# Patient Record
Sex: Male | Born: 1960 | Race: White | Hispanic: No | Smoking: Current some day smoker
Health system: Southern US, Community
[De-identification: ages and names within clinical notes are randomized; demographics above are authoritative.]

## PROBLEM LIST (undated history)

## (undated) DIAGNOSIS — IMO0002 Reserved for concepts with insufficient information to code with codable children: Secondary | ICD-10-CM

## (undated) DIAGNOSIS — F329 Major depressive disorder, single episode, unspecified: Secondary | ICD-10-CM

## (undated) DIAGNOSIS — F32A Depression, unspecified: Secondary | ICD-10-CM

## (undated) DIAGNOSIS — I10 Essential (primary) hypertension: Secondary | ICD-10-CM

## (undated) HISTORY — PX: CERVICAL SPINE SURGERY: SHX589

---

## 2004-03-25 ENCOUNTER — Ambulatory Visit: Payer: Self-pay | Admitting: Nurse Practitioner

## 2004-03-26 ENCOUNTER — Ambulatory Visit: Payer: Self-pay | Admitting: *Deleted

## 2004-07-12 ENCOUNTER — Emergency Department (HOSPITAL_COMMUNITY): Admission: EM | Admit: 2004-07-12 | Discharge: 2004-07-12 | Payer: Self-pay | Admitting: Emergency Medicine

## 2004-07-14 ENCOUNTER — Ambulatory Visit: Payer: Self-pay | Admitting: Nurse Practitioner

## 2004-07-24 ENCOUNTER — Emergency Department (HOSPITAL_COMMUNITY): Admission: EM | Admit: 2004-07-24 | Discharge: 2004-07-24 | Payer: Self-pay | Admitting: Emergency Medicine

## 2004-07-28 ENCOUNTER — Ambulatory Visit: Payer: Self-pay | Admitting: Nurse Practitioner

## 2004-10-21 ENCOUNTER — Ambulatory Visit: Payer: Self-pay | Admitting: Internal Medicine

## 2005-03-23 ENCOUNTER — Emergency Department (HOSPITAL_COMMUNITY): Admission: EM | Admit: 2005-03-23 | Discharge: 2005-03-23 | Payer: Self-pay | Admitting: Family Medicine

## 2005-04-11 ENCOUNTER — Ambulatory Visit: Payer: Self-pay | Admitting: Nurse Practitioner

## 2005-04-19 ENCOUNTER — Ambulatory Visit: Payer: Self-pay | Admitting: Nurse Practitioner

## 2005-05-19 ENCOUNTER — Ambulatory Visit: Payer: Self-pay | Admitting: Nurse Practitioner

## 2005-06-08 ENCOUNTER — Ambulatory Visit: Payer: Self-pay | Admitting: Cardiology

## 2005-06-08 ENCOUNTER — Encounter: Payer: Self-pay | Admitting: Cardiology

## 2005-06-08 ENCOUNTER — Ambulatory Visit (HOSPITAL_COMMUNITY): Admission: RE | Admit: 2005-06-08 | Discharge: 2005-06-08 | Payer: Self-pay | Admitting: Unknown Physician Specialty

## 2005-06-27 ENCOUNTER — Ambulatory Visit (HOSPITAL_COMMUNITY): Admission: RE | Admit: 2005-06-27 | Discharge: 2005-06-27 | Payer: Self-pay | Admitting: Nurse Practitioner

## 2005-06-27 ENCOUNTER — Ambulatory Visit: Payer: Self-pay | Admitting: Nurse Practitioner

## 2005-06-30 ENCOUNTER — Ambulatory Visit: Payer: Self-pay | Admitting: Nurse Practitioner

## 2005-11-14 ENCOUNTER — Ambulatory Visit: Payer: Self-pay | Admitting: Nurse Practitioner

## 2006-01-26 ENCOUNTER — Ambulatory Visit: Payer: Self-pay | Admitting: Nurse Practitioner

## 2006-01-27 ENCOUNTER — Ambulatory Visit (HOSPITAL_COMMUNITY): Admission: RE | Admit: 2006-01-27 | Discharge: 2006-01-27 | Payer: Self-pay | Admitting: Family Medicine

## 2006-02-08 ENCOUNTER — Ambulatory Visit: Payer: Self-pay | Admitting: Nurse Practitioner

## 2006-10-11 ENCOUNTER — Emergency Department (HOSPITAL_COMMUNITY): Admission: EM | Admit: 2006-10-11 | Discharge: 2006-10-11 | Payer: Self-pay | Admitting: Emergency Medicine

## 2006-11-15 ENCOUNTER — Encounter (INDEPENDENT_AMBULATORY_CARE_PROVIDER_SITE_OTHER): Payer: Self-pay | Admitting: *Deleted

## 2006-12-16 ENCOUNTER — Emergency Department (HOSPITAL_COMMUNITY): Admission: EM | Admit: 2006-12-16 | Discharge: 2006-12-16 | Payer: Self-pay | Admitting: Emergency Medicine

## 2006-12-18 ENCOUNTER — Emergency Department (HOSPITAL_COMMUNITY): Admission: EM | Admit: 2006-12-18 | Discharge: 2006-12-18 | Payer: Self-pay | Admitting: Emergency Medicine

## 2007-02-26 ENCOUNTER — Emergency Department (HOSPITAL_COMMUNITY): Admission: EM | Admit: 2007-02-26 | Discharge: 2007-02-27 | Payer: Self-pay | Admitting: Emergency Medicine

## 2007-03-01 ENCOUNTER — Emergency Department (HOSPITAL_COMMUNITY): Admission: EM | Admit: 2007-03-01 | Discharge: 2007-03-01 | Payer: Self-pay | Admitting: Emergency Medicine

## 2007-04-10 ENCOUNTER — Emergency Department (HOSPITAL_COMMUNITY): Admission: EM | Admit: 2007-04-10 | Discharge: 2007-04-11 | Payer: Self-pay | Admitting: Emergency Medicine

## 2007-04-19 ENCOUNTER — Ambulatory Visit: Payer: Self-pay | Admitting: Internal Medicine

## 2007-05-18 ENCOUNTER — Ambulatory Visit: Payer: Self-pay | Admitting: Internal Medicine

## 2007-05-23 ENCOUNTER — Ambulatory Visit (HOSPITAL_COMMUNITY): Admission: RE | Admit: 2007-05-23 | Discharge: 2007-05-23 | Payer: Self-pay | Admitting: Family Medicine

## 2007-06-21 ENCOUNTER — Ambulatory Visit: Payer: Self-pay | Admitting: Internal Medicine

## 2007-10-22 ENCOUNTER — Inpatient Hospital Stay (HOSPITAL_COMMUNITY): Admission: RE | Admit: 2007-10-22 | Discharge: 2007-10-24 | Payer: Self-pay | Admitting: Neurosurgery

## 2007-12-07 ENCOUNTER — Ambulatory Visit: Payer: Self-pay | Admitting: Internal Medicine

## 2007-12-07 LAB — CONVERTED CEMR LAB
AST: 19 units/L (ref 0–37)
Albumin: 4.3 g/dL (ref 3.5–5.2)
Amphetamine Screen, Ur: NEGATIVE
BUN: 13 mg/dL (ref 6–23)
Basophils Relative: 1 % (ref 0–1)
Benzodiazepines.: NEGATIVE
CO2: 20 meq/L (ref 19–32)
Chloride: 103 meq/L (ref 96–112)
Creatinine, Ser: 1.04 mg/dL (ref 0.40–1.50)
Creatinine,U: 190.4 mg/dL
Glucose, Bld: 87 mg/dL (ref 70–99)
Lymphocytes Relative: 31 % (ref 12–46)
Lymphs Abs: 2.2 10*3/uL (ref 0.7–4.0)
MCHC: 33.5 g/dL (ref 30.0–36.0)
MCV: 90.3 fL (ref 78.0–100.0)
Marijuana Metabolite: NEGATIVE
Microalb, Ur: 0.47 mg/dL (ref 0.00–1.89)
Neutro Abs: 4.1 10*3/uL (ref 1.7–7.7)
Neutrophils Relative %: 59 % (ref 43–77)
Phencyclidine (PCP): NEGATIVE
Platelets: 311 10*3/uL (ref 150–400)
Potassium: 4.2 meq/L (ref 3.5–5.3)
RDW: 13.1 % (ref 11.5–15.5)
Total Bilirubin: 0.3 mg/dL (ref 0.3–1.2)

## 2007-12-08 ENCOUNTER — Emergency Department (HOSPITAL_COMMUNITY): Admission: EM | Admit: 2007-12-08 | Discharge: 2007-12-08 | Payer: Self-pay | Admitting: Emergency Medicine

## 2008-01-15 ENCOUNTER — Ambulatory Visit: Payer: Self-pay | Admitting: Internal Medicine

## 2008-01-16 ENCOUNTER — Encounter (INDEPENDENT_AMBULATORY_CARE_PROVIDER_SITE_OTHER): Payer: Self-pay | Admitting: Internal Medicine

## 2008-01-16 LAB — CONVERTED CEMR LAB
ALT: 12 units/L (ref 0–53)
AST: 17 units/L (ref 0–37)
Alkaline Phosphatase: 57 units/L (ref 39–117)
Basophils Absolute: 0.1 10*3/uL (ref 0.0–0.1)
Benzodiazepines.: NEGATIVE
Calcium: 9 mg/dL (ref 8.4–10.5)
Creatinine, Ser: 0.91 mg/dL (ref 0.40–1.50)
HCT: 46.3 % (ref 39.0–52.0)
MCHC: 32.8 g/dL (ref 30.0–36.0)
MCV: 88 fL (ref 78.0–100.0)
Marijuana Metabolite: NEGATIVE
Monocytes Absolute: 0.4 10*3/uL (ref 0.1–1.0)
Monocytes Relative: 8 % (ref 3–12)
Phencyclidine (PCP): NEGATIVE
Platelets: 251 10*3/uL (ref 150–400)
Potassium: 3.9 meq/L (ref 3.5–5.3)
Propoxyphene: NEGATIVE
RBC: 5.26 M/uL (ref 4.22–5.81)
Sodium: 138 meq/L (ref 135–145)
WBC: 5.8 10*3/uL (ref 4.0–10.5)

## 2008-01-22 ENCOUNTER — Encounter: Payer: Self-pay | Admitting: Internal Medicine

## 2008-01-22 ENCOUNTER — Ambulatory Visit (HOSPITAL_COMMUNITY): Admission: RE | Admit: 2008-01-22 | Discharge: 2008-01-22 | Payer: Self-pay | Admitting: Internal Medicine

## 2008-02-12 ENCOUNTER — Ambulatory Visit: Payer: Self-pay | Admitting: Internal Medicine

## 2008-02-13 ENCOUNTER — Ambulatory Visit: Payer: Self-pay | Admitting: Internal Medicine

## 2008-04-29 ENCOUNTER — Ambulatory Visit: Payer: Self-pay | Admitting: Internal Medicine

## 2008-05-09 ENCOUNTER — Ambulatory Visit (HOSPITAL_COMMUNITY): Admission: RE | Admit: 2008-05-09 | Discharge: 2008-05-09 | Payer: Self-pay | Admitting: Internal Medicine

## 2008-06-26 ENCOUNTER — Emergency Department (HOSPITAL_COMMUNITY): Admission: EM | Admit: 2008-06-26 | Discharge: 2008-06-26 | Payer: Self-pay | Admitting: Emergency Medicine

## 2008-06-29 ENCOUNTER — Emergency Department (HOSPITAL_COMMUNITY): Admission: EM | Admit: 2008-06-29 | Discharge: 2008-06-29 | Payer: Self-pay | Admitting: Emergency Medicine

## 2008-07-30 ENCOUNTER — Ambulatory Visit: Payer: Self-pay | Admitting: Internal Medicine

## 2008-08-26 ENCOUNTER — Encounter: Payer: Self-pay | Admitting: Internal Medicine

## 2008-08-26 ENCOUNTER — Ambulatory Visit: Payer: Self-pay | Admitting: Internal Medicine

## 2008-09-10 DIAGNOSIS — I498 Other specified cardiac arrhythmias: Secondary | ICD-10-CM

## 2008-09-12 ENCOUNTER — Ambulatory Visit: Payer: Self-pay | Admitting: Internal Medicine

## 2008-09-12 DIAGNOSIS — R079 Chest pain, unspecified: Secondary | ICD-10-CM

## 2008-09-16 LAB — CONVERTED CEMR LAB
Free T4: 0.6 ng/dL (ref 0.6–1.6)
TSH: 0.38 microintl units/mL (ref 0.35–5.50)

## 2008-10-07 ENCOUNTER — Encounter: Payer: Self-pay | Admitting: Internal Medicine

## 2008-10-07 ENCOUNTER — Ambulatory Visit: Payer: Self-pay

## 2008-10-10 ENCOUNTER — Telehealth: Payer: Self-pay | Admitting: Internal Medicine

## 2008-10-17 ENCOUNTER — Ambulatory Visit: Payer: Self-pay | Admitting: Internal Medicine

## 2009-02-09 ENCOUNTER — Ambulatory Visit: Payer: Self-pay | Admitting: Internal Medicine

## 2009-03-19 ENCOUNTER — Ambulatory Visit: Payer: Self-pay | Admitting: Internal Medicine

## 2009-05-05 ENCOUNTER — Ambulatory Visit: Payer: Self-pay | Admitting: Internal Medicine

## 2009-05-05 LAB — CONVERTED CEMR LAB
CO2: 24 meq/L (ref 19–32)
Chloride: 101 meq/L (ref 96–112)
Sodium: 138 meq/L (ref 135–145)

## 2009-05-11 ENCOUNTER — Emergency Department (HOSPITAL_COMMUNITY): Admission: EM | Admit: 2009-05-11 | Discharge: 2009-05-11 | Payer: Self-pay | Admitting: Emergency Medicine

## 2009-06-05 ENCOUNTER — Ambulatory Visit: Payer: Self-pay | Admitting: Family Medicine

## 2009-06-30 ENCOUNTER — Ambulatory Visit: Payer: Self-pay | Admitting: Internal Medicine

## 2009-07-20 ENCOUNTER — Ambulatory Visit: Payer: Self-pay | Admitting: Internal Medicine

## 2009-08-12 ENCOUNTER — Ambulatory Visit (HOSPITAL_BASED_OUTPATIENT_CLINIC_OR_DEPARTMENT_OTHER): Admission: RE | Admit: 2009-08-12 | Discharge: 2009-08-12 | Payer: Self-pay | Admitting: Internal Medicine

## 2009-08-22 ENCOUNTER — Ambulatory Visit: Payer: Self-pay | Admitting: Internal Medicine

## 2010-05-23 LAB — BASIC METABOLIC PANEL WITH GFR
CO2: 23 meq/L (ref 19–32)
Calcium: 9.6 mg/dL (ref 8.4–10.5)
GFR calc Af Amer: >60 mL/min (ref >60)
GFR calc non Af Amer: 55 mL/min — ABNORMAL LOW (ref >60)
Glucose, Bld: 73 mg/dL (ref 70–99)
Potassium: 4.3 meq/L (ref 3.5–5.1)
Sodium: 141 meq/L (ref 135–145)

## 2010-05-23 LAB — DIFFERENTIAL
Basophils Absolute: 0.1 10*3/uL (ref 0.0–0.1)
Basophils Relative: 2 % — ABNORMAL HIGH (ref 0–1)
Eosinophils Absolute: 0.3 K/uL (ref 0.0–0.7)
Eosinophils Relative: 5 % (ref 0–5)
Lymphocytes Relative: 23 % (ref 12–46)
Lymphs Abs: 1.6 10*3/uL (ref 0.7–4.0)
Monocytes Absolute: 0.7 K/uL (ref 0.1–1.0)
Monocytes Relative: 10 % (ref 3–12)
Neutro Abs: 4.4 10*3/uL (ref 1.7–7.7)
Neutrophils Relative %: 62 % (ref 43–77)

## 2010-05-23 LAB — CBC
HCT: 44.6 % (ref 39.0–52.0)
Hemoglobin: 15.1 g/dL (ref 13.0–17.0)
MCHC: 33.9 g/dL (ref 30.0–36.0)
MCV: 92.5 fL (ref 78.0–100.0)
Platelets: 250 10*3/uL (ref 150–400)
RBC: 4.82 MIL/uL (ref 4.22–5.81)
RDW: 13.6 % (ref 11.5–15.5)
WBC: 7.2 10*3/uL (ref 4.0–10.5)

## 2010-05-23 LAB — RAPID URINE DRUG SCREEN, HOSP PERFORMED
Amphetamines: NOT DETECTED
Barbiturates: NOT DETECTED
Benzodiazepines: NOT DETECTED
Cocaine: NOT DETECTED
Opiates: NOT DETECTED
Tetrahydrocannabinol: NOT DETECTED

## 2010-05-23 LAB — BASIC METABOLIC PANEL
BUN: 5 mg/dL — ABNORMAL LOW (ref 6–23)
Chloride: 102 mEq/L (ref 96–112)
Creatinine, Ser: 1.37 mg/dL (ref 0.4–1.5)

## 2010-05-23 LAB — D-DIMER, QUANTITATIVE: D-Dimer, Quant: 0.43 ug{FEU}/mL (ref 0.00–0.48)

## 2010-05-23 LAB — ETHANOL: Alcohol, Ethyl (B): <5 mg/dL (ref 0–10)

## 2010-06-09 LAB — WOUND CULTURE

## 2010-07-13 NOTE — Op Note (Signed)
NAME:  Richard Dunn, Richard Dunn NO.:  [id number]   MEDICAL RECORD NO.:  [id number]          PATIENT TYPE:  INP   LOCATION:  3112                         FACILITY:  [facility]   PHYSICIAN:  Hewitt Shorts, M.D.DATE OF BIRTH:  [birth year]   DATE OF PROCEDURE:  10/22/2007  DATE OF DISCHARGE:                               OPERATIVE REPORT   PREOPERATIVE DIAGNOSES:  1. Multilevel spondylotic cervical disk herniations.  2. Cervical spondylosis.  3. Cervical degenerative disk disease.  4. Cervical radiculopathy.   POSTOPERATIVE DIAGNOSES:  1. Multiple level spondylotic cervical disk herniations.  2. Cervical spondylosis.  3. Cervical degenerative disk disease.  4. Cervical radiculopathy.   PROCEDURE:  C3-C4, C4-C5, C5-C6, and C6-C7 anterior cervical  decompression and arthrodesis with allograft and Tether cervical  plating.   SURGEON:  Hewitt Shorts, MD   ASSISTANT:  Nelia Shi. Webb Silversmith, NP and Hilda Lias, M.D.   ANESTHESIA:  General endotracheal.   INDICATIONS:  The patient is a 50 year old man who presented with  progressive weakness, left upper extremity worse than right upper  extremity, associated with atrophy, weakness of the left biceps,  triceps, intrinsics, and grip as well as weakness of the right  intrinsics.  X-rays and MRI scan showed multilevel spondylosis and  degenerative disk disease with spondylotic disk herniations,  particularly through the left side, at multiple levels.  A decision was  made to proceed with decompression and arthrodesis.   PROCEDURE:  The patient brought to the operating room, placed under  general endotracheal anesthesia.  The patient was placed in 10 pounds of  Holter traction. The neck was prepped with Betadine soap and solution  and draped in sterile fashion.  An oblique incision was made in the left  side of the neck paralleling the anterior border of the left  sternocleidomastoid.  The line of the incision was  infiltrated with  local anesthetic with epinephrine and incision made and carried down  through subcutaneous tissue and platysma.  Bipolar cautery was used to  maintain hemostasis.  Dissection was then carried out through an  avascular plane leaving the sternocleidomastoid, carotid artery, and  jugular vein laterally and the trachea and esophagus medially, and the  ventral aspect of the vertebral column was identified and a localizing x-  ray taken.  The C3-C4, C4-C5, C5-C6, and C6-C7 intervertebral disk  spaces were identified.  At each level, the anterior longitudinal  ligament was incised and the disk space entered and diskectomy initiated  using a variety of microcurettes and pituitary rongeurs at each level.  Anterior osteophytic overgrowth was removed and then we continued the  diskectomy posteriorly at each level and then began to remove the  cartilaginous endplates using microcurettes along with the X-Max drill.  The microscope was draped and brought into the field to provide  additional navigation, illumination, and visualization, and the  remainder of the decompression was performed using microdissection and  microsurgical technique.  At each level, there was significant posterior  osteophytic overgrowth, and this was carefully removed using the  X-Max  drill and a 2-mm Kerrison punch with a  thin footplate.  There were  significant spondylotic disk herniations at each level, worst at C4-C5  and C5-C6 levels.  At each level, this was carefully removed  decompressing the spinal canal and thecal sac and subsequently the  neural foramina and exiting nerve roots.  Once the decompression was  completed at each level, hemostasis was established with the use of  Gelfoam soaked in thrombin.  We measured the height of the  intervertebral disk space and selected a 6-mm implant for the C3-C4  level and 7-mm implants for the C4-C5, C5-C6, and C6-C7 levels.  Each of  the implants was hydrated  in saline solution and then positioned into  vertebral implant at each level and they were countersunk.  We then  further prepared the ventral surface for plating and selected a 68-mm  Tether cervical plate, it was positioned over the fusion construct and  secured to the vertebra with 4 x 15-mm variable-angled screws placing a  pair of screws at C3, another pair at C7, and single screws at C4, C5,  and C6.  Screw holes were drilled and the screws placed, and then once  all 7 screws were placed final tightening was performed.  The wound was  irrigated with bacitracin solution and checked for hemostasis, which was  established and confirmed.  An x-ray was taken, which showed the plate,  screws, as well as grafts in good position, the alignment was good, and  then we  proceeded with closure.  This was closed using interrupted  inverted 2-0 undyed Vicryl sutures.  The subcutaneous and subcuticular  layer closed with interrupted inverted 3-0 undyed Vicryl sutures.  The  skin was then closed with surgical staples.  The wound was dressed with  Adaptic and sterile gauze.  The procedure was tolerated well.  Estimated  blood loss was 150 mL.  Sponge and needle count correct.  Following the  surgery, the patient was placed in an Aspen cervical collar to be  reversed from the anesthetic, extubated, and transferred to the recovery  room for further care.      Hewitt Shorts, M.D.  Electronically Signed     RWN/MEDQ  D:  10/22/2007  T:  10/23/2007  Job:  [id number]

## 2010-08-06 ENCOUNTER — Emergency Department (HOSPITAL_COMMUNITY)
Admission: EM | Admit: 2010-08-06 | Discharge: 2010-08-09 | Disposition: A | Discharge status: Home / Self Care | Payer: Medicare Other | Attending: Emergency Medicine | Admitting: Emergency Medicine

## 2010-08-06 DIAGNOSIS — F1210 Cannabis abuse, uncomplicated: Secondary | ICD-10-CM | POA: Insufficient documentation

## 2010-08-06 DIAGNOSIS — Z8614 Personal history of Methicillin resistant Staphylococcus aureus infection: Secondary | ICD-10-CM | POA: Insufficient documentation

## 2010-08-06 DIAGNOSIS — F329 Major depressive disorder, single episode, unspecified: Secondary | ICD-10-CM | POA: Insufficient documentation

## 2010-08-06 DIAGNOSIS — F1110 Opioid abuse, uncomplicated: Secondary | ICD-10-CM | POA: Insufficient documentation

## 2010-08-06 DIAGNOSIS — F1010 Alcohol abuse, uncomplicated: Secondary | ICD-10-CM | POA: Insufficient documentation

## 2010-08-06 DIAGNOSIS — F3289 Other specified depressive episodes: Secondary | ICD-10-CM | POA: Insufficient documentation

## 2010-08-06 LAB — CBC
Hemoglobin: 16.5 g/dL (ref 13.0–17.0)
MCH: 33.5 pg (ref 26.0–34.0)
MCHC: 35.3 g/dL (ref 30.0–36.0)
Platelets: 202 10*3/uL (ref 150–400)

## 2010-08-06 LAB — BASIC METABOLIC PANEL
CO2: 21 mEq/L (ref 19–32)
Calcium: 8.3 mg/dL — ABNORMAL LOW (ref 8.4–10.5)
Glucose, Bld: 104 mg/dL — ABNORMAL HIGH (ref 70–99)
Potassium: 3.1 mEq/L — ABNORMAL LOW (ref 3.5–5.1)
Sodium: 140 mEq/L (ref 135–145)

## 2010-08-06 LAB — DIFFERENTIAL
Basophils Relative: 2 % — ABNORMAL HIGH (ref 0–1)
Eosinophils Absolute: 0.1 10*3/uL (ref 0.0–0.7)
Eosinophils Relative: 2 % (ref 0–5)
Monocytes Absolute: 0.4 10*3/uL (ref 0.1–1.0)
Monocytes Relative: 8 % (ref 3–12)

## 2010-08-06 LAB — URINALYSIS, ROUTINE W REFLEX MICROSCOPIC
Glucose, UA: NEGATIVE mg/dL
Hgb urine dipstick: NEGATIVE
Leukocytes, UA: NEGATIVE
Nitrite: NEGATIVE
Specific Gravity, Urine: 1.011 (ref 1.005–1.030)
pH: 5.5 (ref 5.0–8.0)

## 2010-08-06 LAB — RAPID URINE DRUG SCREEN, HOSP PERFORMED
Barbiturates: NOT DETECTED
Benzodiazepines: NOT DETECTED

## 2010-08-07 LAB — ETHANOL: Alcohol, Ethyl (B): <11 mg/dL — ABNORMAL HIGH (ref 0–10)

## 2010-08-08 DIAGNOSIS — F1020 Alcohol dependence, uncomplicated: Secondary | ICD-10-CM

## 2010-08-09 DIAGNOSIS — F10231 Alcohol dependence with withdrawal delirium: Secondary | ICD-10-CM

## 2010-08-22 ENCOUNTER — Emergency Department (HOSPITAL_BASED_OUTPATIENT_CLINIC_OR_DEPARTMENT_OTHER)
Admission: EM | Admit: 2010-08-22 | Discharge: 2010-08-22 | Disposition: A | Discharge status: Home / Self Care | Payer: Medicaid Other | Attending: Emergency Medicine | Admitting: Emergency Medicine

## 2010-08-22 DIAGNOSIS — N4830 Priapism, unspecified: Secondary | ICD-10-CM | POA: Insufficient documentation

## 2010-08-22 DIAGNOSIS — F1010 Alcohol abuse, uncomplicated: Secondary | ICD-10-CM | POA: Insufficient documentation

## 2010-11-19 LAB — CBC
Hemoglobin: 16.2
MCHC: 34.8
RDW: 13.4

## 2010-11-19 LAB — BASIC METABOLIC PANEL
CO2: 25
Calcium: 9
Glucose, Bld: 99
Sodium: 133 — ABNORMAL LOW

## 2010-11-19 LAB — DIFFERENTIAL
Basophils Absolute: 0.1
Basophils Relative: 1
Eosinophils Absolute: 0.1
Eosinophils Relative: 2
Monocytes Absolute: 0.5
Neutro Abs: 4.3

## 2010-11-19 LAB — RAPID URINE DRUG SCREEN, HOSP PERFORMED: Cocaine: NOT DETECTED

## 2010-11-21 ENCOUNTER — Emergency Department (HOSPITAL_COMMUNITY)
Admission: EM | Admit: 2010-11-21 | Discharge: 2010-11-21 | Disposition: A | Discharge status: Home / Self Care | Payer: Medicare Other | Attending: Emergency Medicine | Admitting: Emergency Medicine

## 2010-11-21 DIAGNOSIS — F3289 Other specified depressive episodes: Secondary | ICD-10-CM | POA: Insufficient documentation

## 2010-11-21 DIAGNOSIS — S0100XA Unspecified open wound of scalp, initial encounter: Secondary | ICD-10-CM | POA: Insufficient documentation

## 2010-11-21 DIAGNOSIS — W19XXXA Unspecified fall, initial encounter: Secondary | ICD-10-CM | POA: Insufficient documentation

## 2010-11-21 DIAGNOSIS — F329 Major depressive disorder, single episode, unspecified: Secondary | ICD-10-CM | POA: Insufficient documentation

## 2010-11-21 DIAGNOSIS — Y92009 Unspecified place in unspecified non-institutional (private) residence as the place of occurrence of the external cause: Secondary | ICD-10-CM | POA: Insufficient documentation

## 2010-11-28 ENCOUNTER — Emergency Department (HOSPITAL_COMMUNITY)
Admission: EM | Admit: 2010-11-28 | Discharge: 2010-11-28 | Disposition: A | Discharge status: Home / Self Care | Payer: Medicare Other | Attending: Emergency Medicine | Admitting: Emergency Medicine

## 2010-11-28 DIAGNOSIS — Z4802 Encounter for removal of sutures: Secondary | ICD-10-CM | POA: Insufficient documentation

## 2010-12-03 LAB — DIFFERENTIAL
Basophils Absolute: 0.1
Basophils Relative: 1
Eosinophils Absolute: 0.1
Monocytes Absolute: 0.4
Neutro Abs: 9.4 — ABNORMAL HIGH
Neutrophils Relative %: 82 — ABNORMAL HIGH

## 2010-12-03 LAB — CBC
HCT: 48.7
Hemoglobin: 17.1 — ABNORMAL HIGH
RBC: 5.46
WBC: 11.5 — ABNORMAL HIGH

## 2010-12-03 LAB — ETHANOL: Alcohol, Ethyl (B): 310 — ABNORMAL HIGH

## 2010-12-03 LAB — COMPREHENSIVE METABOLIC PANEL
Alkaline Phosphatase: 55
BUN: 9
CO2: 25
Chloride: 107
Glucose, Bld: 100 — ABNORMAL HIGH
Potassium: 3.7
Total Bilirubin: 0.8

## 2010-12-03 LAB — SALICYLATE LEVEL: Salicylate Lvl: <4

## 2010-12-13 LAB — WOUND CULTURE

## 2010-12-13 LAB — CBC
MCHC: 33.9
Platelets: 250
RBC: 4.83

## 2010-12-13 LAB — DIFFERENTIAL
Basophils Absolute: 0.1
Basophils Relative: 1
Eosinophils Absolute: 0.1
Monocytes Relative: 7
Neutro Abs: 7.7
Neutrophils Relative %: 73

## 2010-12-13 LAB — BASIC METABOLIC PANEL
BUN: 7
CO2: 26
Calcium: 8.8
Creatinine, Ser: 0.91
GFR calc Af Amer: >60

## 2012-11-11 ENCOUNTER — Encounter (HOSPITAL_COMMUNITY): Payer: Self-pay | Admitting: *Deleted

## 2012-11-11 ENCOUNTER — Emergency Department (HOSPITAL_COMMUNITY)
Admission: EM | Admit: 2012-11-11 | Discharge: 2012-11-12 | Disposition: A | Discharge status: Home / Self Care | Payer: Medicare Other | Attending: Emergency Medicine | Admitting: Emergency Medicine

## 2012-11-11 DIAGNOSIS — S0100XA Unspecified open wound of scalp, initial encounter: Secondary | ICD-10-CM | POA: Insufficient documentation

## 2012-11-11 DIAGNOSIS — S21109A Unspecified open wound of unspecified front wall of thorax without penetration into thoracic cavity, initial encounter: Secondary | ICD-10-CM | POA: Insufficient documentation

## 2012-11-11 DIAGNOSIS — I10 Essential (primary) hypertension: Secondary | ICD-10-CM | POA: Insufficient documentation

## 2012-11-11 DIAGNOSIS — T07XXXA Unspecified multiple injuries, initial encounter: Secondary | ICD-10-CM

## 2012-11-11 DIAGNOSIS — Z8739 Personal history of other diseases of the musculoskeletal system and connective tissue: Secondary | ICD-10-CM | POA: Insufficient documentation

## 2012-11-11 DIAGNOSIS — S0003XA Contusion of scalp, initial encounter: Secondary | ICD-10-CM | POA: Insufficient documentation

## 2012-11-11 DIAGNOSIS — F17200 Nicotine dependence, unspecified, uncomplicated: Secondary | ICD-10-CM | POA: Insufficient documentation

## 2012-11-11 HISTORY — DX: Essential (primary) hypertension: I10

## 2012-11-11 HISTORY — DX: Reserved for concepts with insufficient information to code with codable children: IMO0002
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
## 2012-11-12 ENCOUNTER — Emergency Department (HOSPITAL_COMMUNITY): Payer: Medicare Other

## 2012-11-12 ENCOUNTER — Encounter (HOSPITAL_COMMUNITY): Payer: Self-pay | Admitting: *Deleted

## 2012-11-12 MED ORDER — HYDROCODONE-ACETAMINOPHEN 5-325 MG PO TABS
1.0000 | ORAL_TABLET | Freq: Once | ORAL | Status: AC
  Administered 2012-11-12: 1 via ORAL
  Filled 2012-11-12: qty 1

## 2012-11-12 MED ORDER — HYDROCODONE-ACETAMINOPHEN 5-325 MG PO TABS: 2.0000 | ORAL_TABLET | ORAL | Status: DC | PRN

## 2012-12-21 ENCOUNTER — Emergency Department (HOSPITAL_COMMUNITY)
Admission: EM | Admit: 2012-12-21 | Discharge: 2012-12-22 | Disposition: A | Discharge status: Home / Self Care | Payer: PRIVATE HEALTH INSURANCE | Attending: Emergency Medicine | Admitting: Emergency Medicine

## 2012-12-21 DIAGNOSIS — Z8739 Personal history of other diseases of the musculoskeletal system and connective tissue: Secondary | ICD-10-CM | POA: Insufficient documentation

## 2012-12-21 DIAGNOSIS — S02401A Maxillary fracture, unspecified, initial encounter for closed fracture: Secondary | ICD-10-CM | POA: Insufficient documentation

## 2012-12-21 DIAGNOSIS — S0081XA Abrasion of other part of head, initial encounter: Secondary | ICD-10-CM

## 2012-12-21 DIAGNOSIS — F17200 Nicotine dependence, unspecified, uncomplicated: Secondary | ICD-10-CM | POA: Insufficient documentation

## 2012-12-21 DIAGNOSIS — IMO0002 Reserved for concepts with insufficient information to code with codable children: Secondary | ICD-10-CM

## 2012-12-21 DIAGNOSIS — Z792 Long term (current) use of antibiotics: Secondary | ICD-10-CM | POA: Insufficient documentation

## 2012-12-21 DIAGNOSIS — S02400A Malar fracture unspecified, initial encounter for closed fracture: Secondary | ICD-10-CM | POA: Insufficient documentation

## 2012-12-21 DIAGNOSIS — I10 Essential (primary) hypertension: Secondary | ICD-10-CM | POA: Insufficient documentation

## 2012-12-21 DIAGNOSIS — S0292XA Unspecified fracture of facial bones, initial encounter for closed fracture: Secondary | ICD-10-CM
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
## 2012-12-21 NOTE — ED Notes (Signed)
Bed: JX91 Expected date:  Expected time:  Means of arrival:  Comments: 10M Head injury, neck pain

## 2012-12-22 ENCOUNTER — Emergency Department (HOSPITAL_COMMUNITY): Payer: PRIVATE HEALTH INSURANCE

## 2012-12-22 MED ORDER — HYDROCODONE-ACETAMINOPHEN 5-325 MG PO TABS: 1.0000 | ORAL_TABLET | Freq: Four times a day (QID) | ORAL | Status: DC | PRN

## 2012-12-22 MED ORDER — ONDANSETRON 8 MG PO TBDP
8.0000 mg | ORAL_TABLET | Freq: Once | ORAL | Status: AC
  Administered 2012-12-22: 8 mg via ORAL
  Filled 2012-12-22: qty 1

## 2012-12-22 MED ORDER — CEPHALEXIN 250 MG PO CAPS: 250.0000 mg | ORAL_CAPSULE | Freq: Four times a day (QID) | ORAL | Status: DC

## 2012-12-22 NOTE — ED Notes (Signed)
Pt removed C-collar himself. Pt is A&O and in NAD
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
## 2012-12-23 NOTE — ED Provider Notes (Signed)
Medical screening examination/treatment/procedure(s) were performed by non-physician practitioner and as supervising physician I was immediately available for consultation/collaboration.    Carmel Waddington M Zolton Dowson, MD 12/23/12 0116 

## 2012-12-26 NOTE — ED Provider Notes (Signed)
Medical screening examination/treatment/procedure(s) were performed by non-physician practitioner and as supervising physician I was immediately available for consultation/collaboration.  Flint Melter, MD 12/26/12 1630

## 2013-08-13 ENCOUNTER — Encounter (HOSPITAL_COMMUNITY): Payer: Self-pay | Admitting: Emergency Medicine

## 2013-08-13 ENCOUNTER — Emergency Department (HOSPITAL_COMMUNITY)
Admission: EM | Admit: 2013-08-13 | Discharge: 2013-08-14 | Disposition: A | Discharge status: Other Acute Inpatient Hospital | Payer: PRIVATE HEALTH INSURANCE | Attending: Emergency Medicine | Admitting: Emergency Medicine

## 2013-08-13 DIAGNOSIS — F17200 Nicotine dependence, unspecified, uncomplicated: Secondary | ICD-10-CM | POA: Insufficient documentation

## 2013-08-13 DIAGNOSIS — F1020 Alcohol dependence, uncomplicated: Secondary | ICD-10-CM | POA: Diagnosis present

## 2013-08-13 DIAGNOSIS — I10 Essential (primary) hypertension: Secondary | ICD-10-CM | POA: Insufficient documentation

## 2013-08-13 DIAGNOSIS — F10929 Alcohol use, unspecified with intoxication, unspecified: Secondary | ICD-10-CM

## 2013-08-13 DIAGNOSIS — E876 Hypokalemia: Secondary | ICD-10-CM

## 2013-08-13 DIAGNOSIS — Z8739 Personal history of other diseases of the musculoskeletal system and connective tissue: Secondary | ICD-10-CM | POA: Insufficient documentation

## 2013-08-13 DIAGNOSIS — Z09 Encounter for follow-up examination after completed treatment for conditions other than malignant neoplasm: Secondary | ICD-10-CM

## 2013-08-13 DIAGNOSIS — Z792 Long term (current) use of antibiotics: Secondary | ICD-10-CM | POA: Insufficient documentation

## 2013-08-13 DIAGNOSIS — F1210 Cannabis abuse, uncomplicated: Secondary | ICD-10-CM | POA: Insufficient documentation

## 2013-08-13 DIAGNOSIS — Z9289 Personal history of other medical treatment: Secondary | ICD-10-CM

## 2013-08-13 DIAGNOSIS — F1010 Alcohol abuse, uncomplicated: Secondary | ICD-10-CM | POA: Insufficient documentation

## 2013-08-13 HISTORY — DX: Depression, unspecified: F32.A

## 2013-08-13 HISTORY — DX: Major depressive disorder, single episode, unspecified: F32.9

## 2013-08-13 LAB — CBC WITH DIFFERENTIAL/PLATELET
BASOS ABS: 0.1 10*3/uL (ref 0.0–0.1)
Basophils Relative: 2 % — ABNORMAL HIGH (ref 0–1)
EOS ABS: 0 10*3/uL (ref 0.0–0.7)
Eosinophils Relative: 1 % (ref 0–5)
HEMATOCRIT: 42.3 % (ref 39.0–52.0)
HEMOGLOBIN: 14.9 g/dL (ref 13.0–17.0)
LYMPHS ABS: 1 10*3/uL (ref 0.7–4.0)
Lymphocytes Relative: 18 % (ref 12–46)
MCH: 34.5 pg — ABNORMAL HIGH (ref 26.0–34.0)
MCHC: 35.2 g/dL (ref 30.0–36.0)
MCV: 97.9 fL (ref 78.0–100.0)
Monocytes Absolute: 0.5 10*3/uL (ref 0.1–1.0)
Monocytes Relative: 10 % (ref 3–12)
Neutro Abs: 3.9 10*3/uL (ref 1.7–7.7)
Neutrophils Relative %: 69 % (ref 43–77)
Platelets: 148 10*3/uL — ABNORMAL LOW (ref 150–400)
RBC: 4.32 MIL/uL (ref 4.22–5.81)
RDW: 12.9 % (ref 11.5–15.5)
WBC: 5.6 10*3/uL (ref 4.0–10.5)

## 2013-08-13 LAB — RAPID URINE DRUG SCREEN, HOSP PERFORMED
Amphetamines: NOT DETECTED
BENZODIAZEPINES: NOT DETECTED
Barbiturates: NOT DETECTED
Cocaine: NOT DETECTED
Opiates: NOT DETECTED
Tetrahydrocannabinol: POSITIVE — AB

## 2013-08-13 LAB — COMPREHENSIVE METABOLIC PANEL
ALT: 77 U/L — AB (ref 0–53)
AST: 132 U/L — ABNORMAL HIGH (ref 0–37)
Albumin: 3.4 g/dL — ABNORMAL LOW (ref 3.5–5.2)
Alkaline Phosphatase: 126 U/L — ABNORMAL HIGH (ref 39–117)
BUN: 4 mg/dL — ABNORMAL LOW (ref 6–23)
CO2: 20 mEq/L (ref 19–32)
Calcium: 8.8 mg/dL (ref 8.4–10.5)
Chloride: 102 mEq/L (ref 96–112)
Creatinine, Ser: 0.52 mg/dL (ref 0.50–1.35)
GFR calc Af Amer: >90 mL/min (ref >90)
GFR calc non Af Amer: >90 mL/min (ref >90)
GLUCOSE: 136 mg/dL — AB (ref 70–99)
Potassium: 2.9 mEq/L — CL (ref 3.7–5.3)
SODIUM: 143 meq/L (ref 137–147)
Total Bilirubin: 0.4 mg/dL (ref 0.3–1.2)
Total Protein: 7.6 g/dL (ref 6.0–8.3)

## 2013-08-13 LAB — BASIC METABOLIC PANEL
BUN: 3 mg/dL — AB (ref 6–23)
CALCIUM: 8.3 mg/dL — AB (ref 8.4–10.5)
CO2: 22 mEq/L (ref 19–32)
Chloride: 102 mEq/L (ref 96–112)
Creatinine, Ser: 0.5 mg/dL (ref 0.50–1.35)
GFR calc Af Amer: >90 mL/min (ref >90)
GFR calc non Af Amer: >90 mL/min (ref >90)
GLUCOSE: 145 mg/dL — AB (ref 70–99)
Potassium: 3.7 mEq/L (ref 3.7–5.3)
Sodium: 143 mEq/L (ref 137–147)

## 2013-08-13 LAB — ETHANOL

## 2013-08-13 MED ORDER — POTASSIUM CHLORIDE CRYS ER 20 MEQ PO TBCR
40.0000 meq | EXTENDED_RELEASE_TABLET | Freq: Once | ORAL | Status: AC
  Administered 2013-08-13: 40 meq via ORAL
  Filled 2013-08-13: qty 2

## 2013-08-13 MED ORDER — POTASSIUM CHLORIDE CRYS ER 20 MEQ PO TBCR
60.0000 meq | EXTENDED_RELEASE_TABLET | Freq: Once | ORAL | Status: AC
  Administered 2013-08-13: 60 meq via ORAL
  Filled 2013-08-13: qty 3

## 2013-08-13 MED ORDER — FLUOXETINE HCL 20 MG PO CAPS
20.0000 mg | ORAL_CAPSULE | Freq: Every day | ORAL | Status: DC
  Administered 2013-08-13 – 2013-08-14 (×2): 20 mg via ORAL
  Filled 2013-08-13 (×2): qty 1

## 2013-08-13 NOTE — Progress Notes (Signed)
  CARE MANAGEMENT ED NOTE 08/13/2013  Patient:  Newmark,Tameem   Account Number:  [id number]  Date Initiated:  08/13/2013  Documentation initiated by:  Radford PaxFERRERO,AMY  Subjective/Objective Assessment:   Patient presents to ED with ETOH, brought in by substance group leader.     Subjective/Objective Assessment Detail:     Action/Plan:   Action/Plan Detail:   Anticipated DC Date:       Status Recommendation to Physician:   Result of Recommendation:    Other ED Services  Consult Working Plan    DC Planning Services  Other  PCP issues    Choice offered to / List presented to:            Status of service:  Completed, signed off  ED Comments:   ED Comments Detail:  EDCM spoke to patient at bedside.  EDCM questioned patient if he has a pcp?  Patient responded but difficult to understand patient as he is slurring his words.  Patient stated, "Family physicians, it's in my paperwork."  [facility] with [facility] list of pcps who accept Medicare insurnace within a five mile radius of patient's zip code [phone].  No further EDCM needs at this time.

## 2013-08-13 NOTE — ED Notes (Signed)
[place], MD at bedside 
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
## 2013-08-13 NOTE — ED Notes (Signed)
Pt brought in by substance group leader.  Pt went to group intoxicated. Pt normally drinks liquor, beer and Listerine. Pt smells of Listerine now while triaging and very intoxicated. Pt denies SI/HI.

## 2013-08-13 NOTE — ED Notes (Signed)
Pt's visitor states pt came to group around 1000am smelling of listerine but was only slightly intoxicated at that time. Pt has been getting substance abuse counseling for the past 30 days.

## 2013-08-13 NOTE — ED Provider Notes (Signed)
CSN: [id number]     Arrival date & time 08/13/13  1254 History   First MD Initiated Contact with Patient 08/13/13 1321     Chief Complaint  Patient presents with  . detox    . intoxicated     (Consider location/radiation/quality/duration/timing/severity/associated sxs/prior Treatment) HPI Comments: Patient brought to the emergency department by his substance group leader. Patient showed up to his support group today intoxicated. Patient is scheduled to go to detox next week. Is good but he reports that he is more intoxicated than he normally is and he did not want to leave him alone. Upon arrival, the patient is awake and alert. He is slurring his words and difficult to understand, but does answer questions.   Past Medical History  Diagnosis Date  . Hypertension   . Degenerative disc disease    Past Surgical History  Procedure Laterality Date  . Cervical spine surgery     No family history on file. History  Substance Use Topics  . Smoking status: Current Some Day Smoker  . Smokeless tobacco: Not on file  . Alcohol Use: Yes    Review of Systems  Psychiatric/Behavioral: Negative for suicidal ideas.  All other systems reviewed and are negative.     Allergies  Sulfonamide derivatives  Home Medications   Prior to Admission medications   Medication Sig Start Date End Date Taking? Authorizing Edenilson Austad  cephALEXin (KEFLEX) 250 MG capsule Take 1 capsule (250 mg total) by mouth 4 (four) times daily. 12/22/12   Arman FilterGail K Schulz, NP  HYDROcodone-acetaminophen (NORCO) 5-325 MG per tablet Take 1 tablet by mouth every 6 (six) hours as needed. 12/22/12   Arman FilterGail K Schulz, NP  HYDROcodone-acetaminophen (NORCO/VICODIN) 5-325 MG per tablet Take 2 tablets by mouth every 4 (four) hours as needed for pain. 11/12/12   Darlys Galesavid Masneri, MD  Multiple Vitamins-Minerals (MULTIVITAMIN PO) Take 1 tablet by mouth daily.    Historical Glendoris Nodarse, MD   BP 125/90  Pulse 86  Temp(Src) 97.7 F (36.5 C) (Oral)   Resp 19  SpO2 96% Physical Exam  Constitutional: He appears well-developed and well-nourished. No distress.  Patient smells strongly of mint  HENT:  Head: Normocephalic and atraumatic.  Right Ear: Hearing normal.  Left Ear: Hearing normal.  Nose: Nose normal.  Mouth/Throat: Oropharynx is clear and moist and mucous membranes are normal.  Eyes: Conjunctivae and EOM are normal. Pupils are equal, round, and reactive to light.  Neck: Normal range of motion. Neck supple.  Cardiovascular: Regular rhythm, S1 normal and S2 normal.  Exam reveals no gallop and no friction rub.   No murmur heard. Pulmonary/Chest: Effort normal and breath sounds normal. No respiratory distress. He exhibits no tenderness.  Abdominal: Soft. Normal appearance and bowel sounds are normal. There is no hepatosplenomegaly. There is no tenderness. There is no rebound, no guarding, no tenderness at McBurney's point and negative Murphy's sign. No hernia.  Musculoskeletal: Normal range of motion.  Neurological: He is alert. He has normal strength. No cranial nerve deficit or sensory deficit. Coordination normal. GCS eye subscore is 4. GCS verbal subscore is 4. GCS motor subscore is 6.  Speech is slurred secondary to intoxication, attempts to answer questions but is not completely oriented. No focal deficits noted on exam.  Skin: Skin is warm, dry and intact. No rash noted. No cyanosis.  Psychiatric: Thought content normal. His speech is slurred. He is slowed. Cognition and memory are impaired.    ED Course  Procedures (including critical care time)  Labs Review Labs Reviewed - No data to display  Imaging Review No results found.   EKG Interpretation None      MDM   Final diagnoses:  None   alcohol intoxication  Patient brought to the emergency department for his mental health worker for acute intoxication. Patient has a history of alcoholism and was scheduled to go to detox next week. He apparently has increased  his amount of drinking since he learned he was going to detox and the care worker brought him here over concerns of his safety. Patient is not homicidal or suicidal.   Gilda Creasehristopher J. Pollina, MD 08/13/13 [phone]

## 2013-08-13 NOTE — ED Notes (Signed)
Pt lying in bed talking with RN and phlebotomist. Pt calm and cooperative at this time.

## 2013-08-13 NOTE — ED Notes (Signed)
Pt. Is sleeping and he is aware that we need a urine specimen.

## 2013-08-13 NOTE — ED Notes (Signed)
Pt ambulated to the desk with steady gait.

## 2013-08-13 NOTE — ED Notes (Signed)
Lab called with critical K+2.9 and BAL >498.  [place] EDP notified

## 2013-08-14 ENCOUNTER — Inpatient Hospital Stay (HOSPITAL_COMMUNITY)
Admission: EM | Admit: 2013-08-14 | Discharge: 2013-08-21 | DRG: 915 | Disposition: A | Discharge status: Home / Self Care | Payer: PRIVATE HEALTH INSURANCE | Attending: Internal Medicine | Admitting: Internal Medicine

## 2013-08-14 ENCOUNTER — Encounter (HOSPITAL_COMMUNITY): Payer: Self-pay | Admitting: *Deleted

## 2013-08-14 ENCOUNTER — Observation Stay (HOSPITAL_COMMUNITY)
Admission: AD | Admit: 2013-08-14 | Discharge: 2013-08-14 | Disposition: A | Discharge status: Other Acute Inpatient Hospital | Payer: PRIVATE HEALTH INSURANCE | Source: Intra-hospital | Attending: Emergency Medicine | Admitting: Emergency Medicine

## 2013-08-14 ENCOUNTER — Observation Stay (HOSPITAL_COMMUNITY): Payer: PRIVATE HEALTH INSURANCE

## 2013-08-14 DIAGNOSIS — T380X5A Adverse effect of glucocorticoids and synthetic analogues, initial encounter: Secondary | ICD-10-CM | POA: Diagnosis present

## 2013-08-14 DIAGNOSIS — F10939 Alcohol use, unspecified with withdrawal, unspecified: Secondary | ICD-10-CM

## 2013-08-14 DIAGNOSIS — R0989 Other specified symptoms and signs involving the circulatory and respiratory systems: Secondary | ICD-10-CM | POA: Insufficient documentation

## 2013-08-14 DIAGNOSIS — Z882 Allergy status to sulfonamides status: Secondary | ICD-10-CM

## 2013-08-14 DIAGNOSIS — Z09 Encounter for follow-up examination after completed treatment for conditions other than malignant neoplasm: Secondary | ICD-10-CM

## 2013-08-14 DIAGNOSIS — F17200 Nicotine dependence, unspecified, uncomplicated: Secondary | ICD-10-CM | POA: Insufficient documentation

## 2013-08-14 DIAGNOSIS — D6959 Other secondary thrombocytopenia: Secondary | ICD-10-CM | POA: Diagnosis present

## 2013-08-14 DIAGNOSIS — R7309 Other abnormal glucose: Secondary | ICD-10-CM | POA: Diagnosis present

## 2013-08-14 DIAGNOSIS — J9600 Acute respiratory failure, unspecified whether with hypoxia or hypercapnia: Secondary | ICD-10-CM | POA: Diagnosis present

## 2013-08-14 DIAGNOSIS — R1310 Dysphagia, unspecified: Secondary | ICD-10-CM | POA: Diagnosis present

## 2013-08-14 DIAGNOSIS — F1020 Alcohol dependence, uncomplicated: Secondary | ICD-10-CM

## 2013-08-14 DIAGNOSIS — I498 Other specified cardiac arrhythmias: Secondary | ICD-10-CM | POA: Diagnosis present

## 2013-08-14 DIAGNOSIS — R0609 Other forms of dyspnea: Secondary | ICD-10-CM | POA: Insufficient documentation

## 2013-08-14 DIAGNOSIS — F3289 Other specified depressive episodes: Secondary | ICD-10-CM | POA: Diagnosis present

## 2013-08-14 DIAGNOSIS — F329 Major depressive disorder, single episode, unspecified: Secondary | ICD-10-CM | POA: Insufficient documentation

## 2013-08-14 DIAGNOSIS — IMO0002 Reserved for concepts with insufficient information to code with codable children: Secondary | ICD-10-CM | POA: Insufficient documentation

## 2013-08-14 DIAGNOSIS — T783XXA Angioneurotic edema, initial encounter: Secondary | ICD-10-CM | POA: Insufficient documentation

## 2013-08-14 DIAGNOSIS — Z888 Allergy status to other drugs, medicaments and biological substances status: Secondary | ICD-10-CM

## 2013-08-14 DIAGNOSIS — Z9289 Personal history of other medical treatment: Secondary | ICD-10-CM

## 2013-08-14 DIAGNOSIS — Z981 Arthrodesis status: Secondary | ICD-10-CM

## 2013-08-14 DIAGNOSIS — R490 Dysphonia: Secondary | ICD-10-CM | POA: Insufficient documentation

## 2013-08-14 DIAGNOSIS — I10 Essential (primary) hypertension: Secondary | ICD-10-CM

## 2013-08-14 DIAGNOSIS — R7989 Other specified abnormal findings of blood chemistry: Secondary | ICD-10-CM | POA: Diagnosis present

## 2013-08-14 DIAGNOSIS — F1010 Alcohol abuse, uncomplicated: Secondary | ICD-10-CM | POA: Diagnosis present

## 2013-08-14 DIAGNOSIS — E876 Hypokalemia: Secondary | ICD-10-CM | POA: Diagnosis present

## 2013-08-14 DIAGNOSIS — F10931 Alcohol use, unspecified with withdrawal delirium: Secondary | ICD-10-CM

## 2013-08-14 DIAGNOSIS — T46905A Adverse effect of unspecified agents primarily affecting the cardiovascular system, initial encounter: Secondary | ICD-10-CM | POA: Insufficient documentation

## 2013-08-14 DIAGNOSIS — Y9210 Unspecified residential institution as the place of occurrence of the external cause: Secondary | ICD-10-CM

## 2013-08-14 DIAGNOSIS — J9601 Acute respiratory failure with hypoxia: Secondary | ICD-10-CM

## 2013-08-14 DIAGNOSIS — Z79899 Other long term (current) drug therapy: Secondary | ICD-10-CM | POA: Diagnosis not present

## 2013-08-14 DIAGNOSIS — F10231 Alcohol dependence with withdrawal delirium: Secondary | ICD-10-CM | POA: Diagnosis present

## 2013-08-14 DIAGNOSIS — G9340 Encephalopathy, unspecified: Secondary | ICD-10-CM

## 2013-08-14 DIAGNOSIS — T783XXD Angioneurotic edema, subsequent encounter: Secondary | ICD-10-CM

## 2013-08-14 DIAGNOSIS — F10239 Alcohol dependence with withdrawal, unspecified: Secondary | ICD-10-CM

## 2013-08-14 DIAGNOSIS — T783XXS Angioneurotic edema, sequela: Secondary | ICD-10-CM

## 2013-08-14 LAB — CBC WITH DIFFERENTIAL/PLATELET
BASOS PCT: 1 % (ref 0–1)
Basophils Absolute: 0.1 10*3/uL (ref 0.0–0.1)
Eosinophils Absolute: 0 10*3/uL (ref 0.0–0.7)
Eosinophils Relative: 0 % (ref 0–5)
HCT: 44.8 % (ref 39.0–52.0)
HEMOGLOBIN: 15.7 g/dL (ref 13.0–17.0)
LYMPHS ABS: 1 10*3/uL (ref 0.7–4.0)
Lymphocytes Relative: 14 % (ref 12–46)
MCH: 34.6 pg — ABNORMAL HIGH (ref 26.0–34.0)
MCHC: 35 g/dL (ref 30.0–36.0)
MCV: 98.7 fL (ref 78.0–100.0)
Monocytes Absolute: 0.6 10*3/uL (ref 0.1–1.0)
Monocytes Relative: 8 % (ref 3–12)
NEUTROS PCT: 77 % (ref 43–77)
Neutro Abs: 5.6 10*3/uL (ref 1.7–7.7)
Platelets: 139 10*3/uL — ABNORMAL LOW (ref 150–400)
RBC: 4.54 MIL/uL (ref 4.22–5.81)
RDW: 12.7 % (ref 11.5–15.5)
WBC: 7.2 10*3/uL (ref 4.0–10.5)

## 2013-08-14 LAB — COMPREHENSIVE METABOLIC PANEL
ALBUMIN: 3.3 g/dL — AB (ref 3.5–5.2)
ALT: 70 U/L — AB (ref 0–53)
ALT: 64 U/L — ABNORMAL HIGH (ref 0–53)
AST: 104 U/L — AB (ref 0–37)
AST: 92 U/L — ABNORMAL HIGH (ref 0–37)
Albumin: 3.3 g/dL — ABNORMAL LOW (ref 3.5–5.2)
Alkaline Phosphatase: 125 U/L — ABNORMAL HIGH (ref 39–117)
Alkaline Phosphatase: 122 U/L — ABNORMAL HIGH (ref 39–117)
BILIRUBIN TOTAL: 1 mg/dL (ref 0.3–1.2)
BUN: 5 mg/dL — ABNORMAL LOW (ref 6–23)
BUN: 10 mg/dL (ref 6–23)
CHLORIDE: 95 meq/L — AB (ref 96–112)
CO2: 24 mEq/L (ref 19–32)
CO2: 26 mEq/L (ref 19–32)
Calcium: 9.1 mg/dL (ref 8.4–10.5)
Calcium: 9 mg/dL (ref 8.4–10.5)
Chloride: 95 mEq/L — ABNORMAL LOW (ref 96–112)
Creatinine, Ser: 0.44 mg/dL — ABNORMAL LOW (ref 0.50–1.35)
Creatinine, Ser: 0.62 mg/dL (ref 0.50–1.35)
GFR calc Af Amer: >90 mL/min (ref >90)
GFR calc Af Amer: >90 mL/min (ref >90)
GFR calc non Af Amer: >90 mL/min (ref >90)
Glucose, Bld: 181 mg/dL — ABNORMAL HIGH (ref 70–99)
Glucose, Bld: 157 mg/dL — ABNORMAL HIGH (ref 70–99)
POTASSIUM: 4 meq/L (ref 3.7–5.3)
POTASSIUM: 4.2 meq/L (ref 3.7–5.3)
SODIUM: 136 meq/L — AB (ref 137–147)
Sodium: 137 mEq/L (ref 137–147)
Total Bilirubin: 1.2 mg/dL (ref 0.3–1.2)
Total Protein: 7.4 g/dL (ref 6.0–8.3)
Total Protein: 7.3 g/dL (ref 6.0–8.3)

## 2013-08-14 LAB — TYPE AND SCREEN
ABO/RH(D): A POS
Antibody Screen: NEGATIVE

## 2013-08-14 LAB — RAPID STREP SCREEN (MED CTR MEBANE ONLY): Streptococcus, Group A Screen (Direct): NEGATIVE

## 2013-08-14 MED ORDER — FOLIC ACID 5 MG/ML IJ SOLN
1.0000 mg | Freq: Every day | INTRAMUSCULAR | Status: DC
  Administered 2013-08-15 – 2013-08-19 (×5): 1 mg via INTRAVENOUS
  Filled 2013-08-14 (×8): qty 0.2

## 2013-08-14 MED ORDER — LORAZEPAM 1 MG PO TABS: 0.0000 mg | ORAL_TABLET | Freq: Two times a day (BID) | ORAL | Status: DC

## 2013-08-14 MED ORDER — VITAMIN B-1 100 MG PO TABS
100.0000 mg | ORAL_TABLET | Freq: Every day | ORAL | Status: DC
  Filled 2013-08-14 (×3): qty 1

## 2013-08-14 MED ORDER — FOLIC ACID 1 MG PO TABS
1.0000 mg | ORAL_TABLET | Freq: Every day | ORAL | Status: DC
  Filled 2013-08-14 (×3): qty 1

## 2013-08-14 MED ORDER — LORAZEPAM 1 MG PO TABS
2.0000 mg | ORAL_TABLET | ORAL | Status: AC
  Administered 2013-08-14: 2 mg via ORAL

## 2013-08-14 MED ORDER — NICOTINE 21 MG/24HR TD PT24
21.0000 mg | MEDICATED_PATCH | Freq: Once | TRANSDERMAL | Status: DC
  Administered 2013-08-14: 21 mg via TRANSDERMAL

## 2013-08-14 MED ORDER — LORAZEPAM 1 MG PO TABS: 1.0000 mg | ORAL_TABLET | Freq: Four times a day (QID) | ORAL | Status: DC | PRN

## 2013-08-14 MED ORDER — LORAZEPAM 1 MG PO TABS
ORAL_TABLET | ORAL | Status: AC
  Filled 2013-08-14: qty 2

## 2013-08-14 MED ORDER — LABETALOL HCL 5 MG/ML IV SOLN
10.0000 mg | INTRAVENOUS | Status: DC | PRN
  Administered 2013-08-15: 10 mg via INTRAVENOUS
  Filled 2013-08-14 (×2): qty 4

## 2013-08-14 MED ORDER — RACEPINEPHRINE HCL 2.25 % IN NEBU
0.5000 mL | INHALATION_SOLUTION | Freq: Once | RESPIRATORY_TRACT | Status: AC
  Administered 2013-08-14: 0.5 mL via RESPIRATORY_TRACT
  Filled 2013-08-14: qty 0.5

## 2013-08-14 MED ORDER — THIAMINE HCL 100 MG/ML IJ SOLN
100.0000 mg | Freq: Every day | INTRAMUSCULAR | Status: DC
  Administered 2013-08-15 – 2013-08-19 (×5): 100 mg via INTRAVENOUS
  Filled 2013-08-14 (×5): qty 2

## 2013-08-14 MED ORDER — DIPHENHYDRAMINE HCL 50 MG/ML IJ SOLN
25.0000 mg | Freq: Once | INTRAMUSCULAR | Status: AC
  Administered 2013-08-14: 25 mg via INTRAVENOUS

## 2013-08-14 MED ORDER — RACEPINEPHRINE HCL 2.25 % IN NEBU
INHALATION_SOLUTION | RESPIRATORY_TRACT | Status: AC
  Filled 2013-08-14: qty 0.5

## 2013-08-14 MED ORDER — CHLORHEXIDINE GLUCONATE 0.12 % MT SOLN
15.0000 mL | Freq: Two times a day (BID) | OROMUCOSAL | Status: DC
  Administered 2013-08-15 – 2013-08-17 (×7): 15 mL via OROMUCOSAL
  Filled 2013-08-14 (×8): qty 15

## 2013-08-14 MED ORDER — BIOTENE DRY MOUTH MT LIQD
15.0000 mL | Freq: Two times a day (BID) | OROMUCOSAL | Status: DC
Start: 2013-08-15 — End: 2013-08-15

## 2013-08-14 MED ORDER — DIPHENHYDRAMINE HCL 50 MG/ML IJ SOLN
INTRAMUSCULAR | Status: AC
  Filled 2013-08-14: qty 1

## 2013-08-14 MED ORDER — LISINOPRIL 10 MG PO TABS
10.0000 mg | ORAL_TABLET | Freq: Every day | ORAL | Status: DC
  Administered 2013-08-14: 10 mg via ORAL
  Filled 2013-08-14: qty 1

## 2013-08-14 MED ORDER — LORAZEPAM 2 MG/ML IJ SOLN: 1.0000 mg | Freq: Four times a day (QID) | INTRAMUSCULAR | Status: DC | PRN

## 2013-08-14 MED ORDER — LIDOCAINE HCL 2 % EX GEL
CUTANEOUS | Status: AC
  Filled 2013-08-14: qty 10

## 2013-08-14 MED ORDER — FAMOTIDINE IN NACL 20-0.9 MG/50ML-% IV SOLN
20.0000 mg | Freq: Two times a day (BID) | INTRAVENOUS | Status: DC
  Administered 2013-08-15 – 2013-08-19 (×10): 20 mg via INTRAVENOUS
  Filled 2013-08-14 (×10): qty 50

## 2013-08-14 MED ORDER — METHYLPREDNISOLONE SODIUM SUCC 40 MG IJ SOLR
40.0000 mg | Freq: Four times a day (QID) | INTRAMUSCULAR | Status: DC
  Administered 2013-08-14 – 2013-08-16 (×6): 40 mg via INTRAVENOUS
  Filled 2013-08-14 (×6): qty 1

## 2013-08-14 MED ORDER — KCL IN DEXTROSE-NACL 20-5-0.45 MEQ/L-%-% IV SOLN
INTRAVENOUS | Status: DC
  Administered 2013-08-15: 100 mL/h via INTRAVENOUS
  Administered 2013-08-15: 02:00:00 via INTRAVENOUS
  Administered 2013-08-15: 100 mL/h via INTRAVENOUS
  Administered 2013-08-16 – 2013-08-20 (×8): via INTRAVENOUS
  Filled 2013-08-14 (×10): qty 1000

## 2013-08-14 MED ORDER — CLONIDINE HCL 0.1 MG PO TABS
0.2000 mg | ORAL_TABLET | Freq: Two times a day (BID) | ORAL | Status: DC
  Administered 2013-08-14 (×2): 0.2 mg via ORAL
  Filled 2013-08-14 (×2): qty 2

## 2013-08-14 MED ORDER — METHYLPREDNISOLONE SODIUM SUCC 125 MG IJ SOLR
125.0000 mg | Freq: Once | INTRAMUSCULAR | Status: AC
  Administered 2013-08-14: 125 mg via INTRAVENOUS

## 2013-08-14 MED ORDER — LORAZEPAM 1 MG PO TABS: 0.0000 mg | ORAL_TABLET | Freq: Four times a day (QID) | ORAL | Status: DC

## 2013-08-14 MED ORDER — ALBUTEROL SULFATE (2.5 MG/3ML) 0.083% IN NEBU: 2.5000 mg | INHALATION_SOLUTION | RESPIRATORY_TRACT | Status: DC | PRN

## 2013-08-14 MED ORDER — DIPHENHYDRAMINE HCL 50 MG/ML IJ SOLN
50.0000 mg | Freq: Four times a day (QID) | INTRAMUSCULAR | Status: DC
  Administered 2013-08-14 – 2013-08-16 (×6): 50 mg via INTRAVENOUS
  Filled 2013-08-14 (×6): qty 1

## 2013-08-14 MED ORDER — HEPARIN SODIUM (PORCINE) 5000 UNIT/ML IJ SOLN
5000.0000 [IU] | Freq: Three times a day (TID) | INTRAMUSCULAR | Status: DC
  Administered 2013-08-15 – 2013-08-21 (×21): 5000 [IU] via SUBCUTANEOUS
  Filled 2013-08-14 (×24): qty 1

## 2013-08-14 MED ORDER — LORAZEPAM 2 MG/ML IJ SOLN
2.0000 mg | INTRAMUSCULAR | Status: DC | PRN
  Administered 2013-08-15: 1 mg via INTRAVENOUS
  Administered 2013-08-15 (×2): 2 mg via INTRAVENOUS
  Filled 2013-08-14 (×3): qty 1

## 2013-08-14 MED ORDER — THIAMINE HCL 100 MG/ML IJ SOLN: 100.0000 mg | Freq: Every day | INTRAMUSCULAR | Status: DC

## 2013-08-14 MED ORDER — FAMOTIDINE IN NACL 20-0.9 MG/50ML-% IV SOLN
20.0000 mg | Freq: Once | INTRAVENOUS | Status: AC
  Administered 2013-08-14: 20 mg via INTRAVENOUS
  Filled 2013-08-14: qty 50

## 2013-08-14 MED ORDER — METHYLPREDNISOLONE SODIUM SUCC 125 MG IJ SOLR
INTRAMUSCULAR | Status: AC
  Filled 2013-08-14: qty 2

## 2013-08-14 MED ORDER — INSULIN ASPART 100 UNIT/ML ~~LOC~~ SOLN
0.0000 [IU] | SUBCUTANEOUS | Status: DC
  Administered 2013-08-15: 3 [IU] via SUBCUTANEOUS
  Administered 2013-08-15 (×4): 4 [IU] via SUBCUTANEOUS
  Administered 2013-08-16 (×4): 3 [IU] via SUBCUTANEOUS
  Administered 2013-08-16: 4 [IU] via SUBCUTANEOUS

## 2013-08-14 MED ORDER — ADULT MULTIVITAMIN W/MINERALS CH
1.0000 | ORAL_TABLET | Freq: Every day | ORAL | Status: DC
  Filled 2013-08-14 (×3): qty 1

## 2013-08-14 MED ORDER — LORAZEPAM 1 MG PO TABS
0.0000 mg | ORAL_TABLET | Freq: Four times a day (QID) | ORAL | Status: DC
  Administered 2013-08-14 (×3): 1 mg via ORAL
  Filled 2013-08-14 (×3): qty 1

## 2013-08-14 NOTE — ED Notes (Signed)
[place] EDP notified re pt's elevated BP

## 2013-08-14 NOTE — Consult Note (Signed)
Richard Dunn,  Richard Dunn 53 y.o., male [id number]     Chief Complaint: difficulty breathing  HPI: 53 yo wm, [facility].  Reportedly given a new dose of ACE inhibitors this evening, after having taken it without difficulty years ago.   Onset throat swelling and difficulty breathing 5 hrs ago.  [facility]  Presumptive diagnosis ACE related angioedema.  No clear progression during interval here.  No pain.  Rec'd Decadron, Benadryl, Pepcid, racemic epi.  Perhaps very slight improvement.  Handling secretions with some difficulty.    PMH: Past Medical History  Diagnosis Date  . Hypertension   . Degenerative disc disease   . Depression     Surg Hx: Past Surgical History  Procedure Laterality Date  . Cervical spine surgery      FHx:  History reviewed. No pertinent family history. SocHx:  reports that he has been smoking.  He does not have any smokeless tobacco history on file. He reports that he drinks alcohol. He reports that he does not use illicit drugs.  ALLERGIES:  Allergies  Allergen Reactions  . Other     Mycin Drugs  . Sulfonamide Derivatives Other (See Comments)    Childhood allergy     (Not in a hospital admission)  Results for orders placed during the hospital encounter of 08/14/13 (from the past 48 hour(s))  RAPID STREP SCREEN     Status: None   Collection Time    08/14/13  9:54 PM      Result Value Ref Range   Streptococcus, Group A Screen (Direct) NEGATIVE  NEGATIVE   Comment: (NOTE)     A Rapid Antigen test may result negative if the antigen level in the     sample is below the detection level of this test. The FDA has not     cleared this test as a stand-alone test therefore the rapid antigen     negative result has reflexed to a Group A Strep culture.   Dg Neck Soft Tissue  08/14/2013   CLINICAL DATA:  Short of breath.  Angioedema.  EXAM: NECK SOFT TISSUES - 1+ VIEW  COMPARISON:  12/02/2012  FINDINGS: Prevertebral  soft tissue swelling is present. Prevertebral soft tissues anterior to C2 measure up to 23 mm. This represents a substantial interval change compared to prior CT 12/02/2012. No destructive osseous lesions. Long segment cervical fusion that appears to extend from C3 through C7. Epiglottis grossly appears within normal limits allowing for posterior mass effect from prevertebral soft tissue swelling.  IMPRESSION: Marked prevertebral soft tissue swelling. This may be related to edema, prevertebral effusion / retropharyngeal infection.   Electronically Signed   By: Andreas NewportGeoffrey  Lamke M.D.   On: 08/14/2013 22:17   Dg Chest Port 1 View  08/14/2013   CLINICAL DATA:  Short of breath.  Throat swelling.  EXAM: PORTABLE CHEST - 1 VIEW  COMPARISON:  06/27/2005.  FINDINGS: Cardiopericardial silhouette within normal limits. Mediastinal contours normal. Trachea midline. No airspace disease or effusion. Monitoring leads project over the chest. 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  IMPRESSION: No active disease.   Electronically Signed   By: Andreas NewportGeoffrey  Lamke M.D.   On: 08/14/2013 22:14      Blood pressure 157/97, pulse 80, temperature 97.7 F (36.5 C), temperature source Oral, resp. rate 11, SpO2 100.00%.  PHYSICAL EXAM: Overall appearance:  Wm with garbled dysphonic voice.  Coughing secretions and probably against swelling too.  Mental status seems intact. Head:  NCAT Ears:  Not examined Nose:  clear Oral Cavity:  Moist.  Teeth in fair repair. No apparent swelling of lips or tongue. Oral Pharynx/Hypopharynx/Larynx:  Translucent edema of palate and uvula.   Neuro: grossly intact Neck:  No swelling.  Per flexible laryngoscope, palatal edema.  Supraglottic edema .  VC's mobile, but could not actually see cords, nor airway.    Studies Reviewed:  none    Assessment/Plan Angioedema due to ACE inhibitors.  Medical management per CCM and ER physicians.  No obvious  immediate need for airway noted.   Anesthesia not in house.  Pt has multiple level cervical fusion.  Intubation would be difficult.  If deteriorates in any way, may need emergency cricothyrotomy or formal tracheostomy.    Flo ShanksWOLICKI, Amir Fick 08/14/2013, 10:31 PM

## 2013-08-14 NOTE — ED Notes (Signed)
Rhino scope and ENT cart at bedside

## 2013-08-14 NOTE — ED Notes (Signed)
Pt states that he has had increased SOB and a full feeling in his throat for the past 30 mins. PA at [facility] states that he is suspicious for uvulitis. Alert and oriented.  

## 2013-08-14 NOTE — BH Assessment (Signed)
Pt medically cleared per psychiatry [facility]. Pt assigned observation bed #4. Voluntary consent completed and pt's nurse Weber CooksKim Cheeks notified of pt acceptance and will coordinate transport via [place].  Glorious PeachNajah Camdynn Maranto, MS, LCASA Assessment Counselor

## 2013-08-14 NOTE — ED Notes (Signed)
Pt states that he has had increased SOB and a full feeling in his throat for the past 30 mins. PA at [facility] states that he is suspicious for uvulitis. Alert and oriented.

## 2013-08-14 NOTE — ED Notes (Signed)
Pt's chart had to be discharged and readmitted d/t admission in hospital as opposed to [facility]. Please reference previous chart.   Pt previously given 125 solumedrol IV, 25mg  benadryl IV, 20mg  pepcid IVPB, Racemic epi neb.

## 2013-08-14 NOTE — Progress Notes (Signed)
911 called and notified of EMS arrival and departure as requested.

## 2013-08-14 NOTE — ED Notes (Signed)
Pt slept well throughout the night. Pt has increase in blood pressure this morning will inform MD. Pt is alert,oriented and ambulatory. Voices no complaints at this time. TTS consult complete and TTS contacted regarding plan for pt,  there is currently no active plan put into place for this pt. Waiting on progress note from TTS.

## 2013-08-14 NOTE — ED Notes (Signed)
Bed: RESB Expected date:  Expected time:  Means of arrival:  Comments: EMS/from [facility] OBS unit-uvulitis

## 2013-08-14 NOTE — Progress Notes (Signed)
Patient ID: Richard BrownsJames Strider, male   DOB: 07/10/1960, 53 y.o.   MRN: [id number] 911 called per protocol [facility] as ordered by Karleen HampshireSpencer NP for possible airway obstruction.

## 2013-08-14 NOTE — H&P (Signed)
PULMONARY / CRITICAL CARE MEDICINE   Name: Richard Dunn MRN: [id number] DOB: 10/14/1960    ADMISSION DATE:  08/14/2013  REFERRING MD : Benjiman CoreNathan Pickering  CHIEF COMPLAINT:  Difficulty swallowing  BRIEF PATIENT DESCRIPTION:  53 yo male with hx of ETOH presented to behavioral health for alcohol detox.  He was given lisinopril for hypertension.  Developed throat swelling and difficulty breathing.  PCCM asked to admit to ICU.  SIGNIFICANT EVENTS: 6/17 Admit, ENT evaluation in ER  STUDIES:  6/17 Bedside laryngoscopy >> palatal/supraglottic edema, vocal cords mobile  LINES / TUBES: PIV  HISTORY OF PRESENT ILLNESS:   53 yo male with hx of ETOH was admitted to behavioral health for detox.  He drinks alcohol, but also drinks listerine when he can't get alcohol.  He was given lisinopril for high blood pressure.  He then developed throat discomfort around 7 pm on 08/14/13.  He was given decadron and [facility].  In the ER he c/o feeling short of breath.  He was given solumedrol, pepcid, racemic epi neb treatment, and benadryl.  He was seen by ENT and had bedside laryngoscopy done.  He report feeling improvement with his breathing.  PCCM was asked to admit to the ICU.  PAST MEDICAL HISTORY :  Past Medical History  Diagnosis Date  . Hypertension   . Degenerative disc disease   . Depression    Past Surgical History  Procedure Laterality Date  . Cervical spine surgery     Prior to Admission medications   Medication Sig Start Date End Date Taking? Authorizing Franki Alcaide  FLUoxetine (PROZAC) 20 MG capsule Take 20 mg by mouth daily.  08/06/13  Yes Historical Marykate Heuberger, MD  naltrexone (DEPADE) 50 MG tablet Take 50 mg by mouth daily.  08/06/13  Yes Historical Floriene Jeschke, MD   Allergies  Allergen Reactions  . Other     Mycin Drugs  . Sulfonamide Derivatives Other (See Comments)    Childhood allergy    FAMILY HISTORY:  No family history on file. SOCIAL HISTORY:  reports that he has been  smoking.  He does not have any smokeless tobacco history on file. He reports that he drinks alcohol. He reports that he does not use illicit drugs.  REVIEW OF SYSTEMS:   C/o feeling anxious, sweaty, tremulous.  SUBJECTIVE:   VITAL SIGNS: Temp:  [97.5 F (36.4 C)-98.4 F (36.9 C)] 97.7 F (36.5 C) (06/17 2109) Pulse Rate:  [56-111] 80 (06/17 2109) Resp:  [11-20] 11 (06/17 2109) BP: (130-203)/(11-112) 157/97 mmHg (06/17 2109) SpO2:  [95 %-100 %] 100 % (06/17 2109) INTAKE / OUTPUT: Intake/Output   None     PHYSICAL EXAMINATION: General: not using accessory muscles Neuro:  Alert, follows commands, normal strength, mild tremor HEENT:  Pupils reactive, garbled speech, no tongue swelling, no LAN, no stridor Cardiovascular:  Regular, tachycardic Lungs:  No wheeze/rales Abdomen:  Soft, non tender, + bowel sounds Musculoskeletal:  No edema Skin:  Sweaty, no rashes  LABS:  CBC  Recent Labs Lab 08/13/13 1449 08/14/13 2230  WBC 5.6 7.2  HGB 14.9 15.7  HCT 42.3 44.8  PLT 148* 139*   BMET  Recent Labs Lab 08/13/13 1449 08/13/13 2015 08/14/13 1432  NA 143 143 136*  K 2.9* 3.7 4.0  CL 102 102 95*  CO2 20 22 24   BUN 4* 3* 5*  CREATININE 0.52 0.50 0.44*  GLUCOSE 136* 145* 181*   Electrolytes  Recent Labs Lab 08/13/13 1449 08/13/13 2015 08/14/13 1432  CALCIUM  8.8 8.3* 9.1   Liver Enzymes  Recent Labs Lab 08/13/13 1449 08/14/13 1432  AST 132* 104*  ALT 77* 70*  ALKPHOS 126* 125*  BILITOT 0.4 1.0  ALBUMIN 3.4* 3.3*   Glucose No results found for this basename: GLUCAP,  in the last 168 hours  Imaging Dg Neck Soft Tissue  08/14/2013   CLINICAL DATA:  Short of breath.  Angioedema.  EXAM: NECK SOFT TISSUES - 1+ VIEW  COMPARISON:  12/02/2012  FINDINGS: Prevertebral soft tissue swelling is present. Prevertebral soft tissues anterior to C2 measure up to 23 mm. This represents a substantial interval change compared to prior CT 12/02/2012. No destructive osseous  lesions. Long segment cervical fusion that appears to extend from C3 through C7. Epiglottis grossly appears within normal limits allowing for posterior mass effect from prevertebral soft tissue swelling.  IMPRESSION: Marked prevertebral soft tissue swelling. This may be related to edema, prevertebral effusion / retropharyngeal infection.   Electronically Signed   By: Andreas NewportGeoffrey  Lamke M.D.   On: 08/14/2013 22:17   Dg Chest Port 1 View  08/14/2013   CLINICAL DATA:  Short of breath.  Throat swelling.  EXAM: PORTABLE CHEST - 1 VIEW  COMPARISON:  06/27/2005.  FINDINGS: Cardiopericardial silhouette within normal limits. Mediastinal contours normal. Trachea midline. No airspace disease or effusion. Monitoring leads project over the chest. 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  IMPRESSION: No active disease.   Electronically Signed   By: Andreas NewportGeoffrey  Lamke M.D.   On: 08/14/2013 22:14    ASSESSMENT / PLAN:  PULMONARY A: Acute respiratory distress 2nd to angioedema most likely from ACE inhibitor P:   Admit to ICU to monitor for airway compromise >> if he gets worse, then he will need airway placement in OR by anesthesia and ENT Continue solumedrol, benadryl, pepcid Given FFP in ER Oxygen to keep SpO2 > 92% PRN albuterol  CARDIOVASCULAR A:  Sinus tachycardia 2nd to ETOH withdrawal and epi neb treatment. Hx of HTN. P:  Listed ACE inhibitor as allergy PRN labetalol  RENAL A:   Hypokalemia. P:   F/u and replace electrolytes as needed  GASTROINTESTINAL A:   Elevated LFT's 2nd to ETOH. Nutrition. P:   NPO F/u LFT's intermittently Thiamine, folic acid Dextrose in IV fluid  HEMATOLOGIC A:   Thrombocytopenia likely 2nd to ETOH. P:  F/u CBC SQ heparin for DVT prevention  INFECTIOUS A:   No evidence for infection. P:   Monitor for evidence of aspiration  ENDOCRINE A:   Steroid induced hyperglycemia.   P:   SSI while on  steroids  NEUROLOGIC A:   Alcohol withdrawal. Hx of depression. P:   CIWA q4h Ativan prn for CIWA > 8 If unable to control with ativan, then would try precedex  D/w Dr. Lazarus SalinesWolicki, Dr. Rubin PayorPickering, and Anesthesia.  CC time 45 minutes.  Coralyn HellingVineet Sood, MD [facility] 08/14/2013, 11:16 PM Pager:  [phone] After 3pm call: [phone]

## 2013-08-14 NOTE — BH Assessment (Signed)
Tele Assessment Note   Richard Dunn is a 53 y.o. male who presents to [facility] for alcohol and thc detox.  Pt denies SI/HI/AVH.  Pt reports the following: pr brought in by sponsor after showing up to his weekly SA support group, intoxicated and smelling of listerine.  Pt told this Clinical research associate that he is scheduled to go to rehab next but needs detox today.  Pt reports no triggers for his heavy alcohol use.  Pt is visibly intoxicated, exhibiting slurred speech, thought blocking and slight memory loss.  Pt says he drinks 2-1/5's, daily and his last intake was 08/13/13, he drank 1/5 of liquor and listerine. Pt says he drinks 1-2 bottles of mouthwash when he can't buy alcohol.  Pt also admits using 1 joint marijuana, last time he used was 1 wk ago.  Pt denies any seizure/blackout activity, no current w/d sxs, however states when withdrawing, he has tremors and nausea.  Pt says had been sober for 3 yrs and relapsed in may of 2014.         Axis I: Alcohol Use D/O, Severe; Cannabis Use D/O, Mild  Axis II: Deferred Axis III:  Past Medical History  Diagnosis Date  . Hypertension   . Degenerative disc disease   . Depression    Axis IV: other psychosocial or environmental problems, problems related to social environment and problems with primary support group Axis V: 51-60 moderate symptoms  Past Medical History:  Past Medical History  Diagnosis Date  . Hypertension   . Degenerative disc disease   . Depression     Past Surgical History  Procedure Laterality Date  . Cervical spine surgery      Family History: History reviewed. No pertinent family history.  Social History:  reports that he has been smoking.  He does not have any smokeless tobacco history on file. He reports that he drinks alcohol. He reports that he does not use illicit drugs.  Additional Social History:  Alcohol / Drug Use Pain Medications: See MAR  Prescriptions: See MAR  Over the Counter: See MAR  History of alcohol / drug use?:  Yes Longest period of sobriety (when/how long): During detox programming Negative Consequences of Use: Work / School;Personal relationships;Financial Withdrawal Symptoms: Other (Comment) (No current w/d sxs ) Substance #1 Name of Substance 1: Alcohol  1 - Age of First Use: Teens  1 - Amount (size/oz): 2-1/5's  1 - Frequency: Daily  1 - Duration: On-going  1 - Last Use / Amount: 08/13/13 Substance #2 Name of Substance 2: THC  2 - Age of First Use: Teens  2 - Amount (size/oz): 1 Joint  2 - Frequency: Wkly  2 - Duration: On-going  2 - Last Use / Amount: 1 Wk Ago   CIWA: CIWA-Ar BP: 188/112 mmHg Pulse Rate: 70 Nausea and Vomiting: 2 Tactile Disturbances: none Tremor: three Auditory Disturbances: not present Paroxysmal Sweats: no sweat visible Visual Disturbances: not present Anxiety: no anxiety, at ease Headache, Fullness in Head: none present Agitation: somewhat more than normal activity Orientation and Clouding of Sensorium: oriented and can do serial additions CIWA-Ar Total: 6 COWS:    Allergies:  Allergies  Allergen Reactions  . Sulfonamide Derivatives Other (See Comments)    Childhood allergy    Home Medications:  (Not in a hospital admission)  OB/GYN Status:  No LMP for male patient.  General Assessment Data Location of Assessment: [facility] Is this a Tele or Face-to-Face Assessment?: Face-to-Face Is this an Initial Assessment or  a Re-assessment for this encounter?: Initial Assessment Living Arrangements: Alone Can pt return to current living arrangement?: Yes Admission Status: Voluntary Is patient capable of signing voluntary admission?: Yes Transfer from: Acute Hospital Referral Source: MD  Medical Screening Exam [facility] Walk-in ONLY) Medical Exam completed: No Reason for MSE not completed: Other: (None )  [facility] Living Arrangements: Alone Name of Psychiatrist: None  Name of Therapist: None   Education Status Is patient currently in  school?: No Current Grade: None  Highest grade of school patient has completed: None  Name of school: None  Contact person: None   Risk to self Suicidal Ideation: No Suicidal Intent: No Is patient at risk for suicide?: No Suicidal Plan?: No Access to Means: No What has been your use of drugs/alcohol within the last 12 months?: Abusing: alcohil  Previous Attempts/Gestures: No How many times?: 0 Other Self Harm Risks: None  Triggers for Past Attempts: None known Intentional Self Injurious Behavior: None Family Suicide History: No Recent stressful life event(s): Other (Comment) (Chronic SA ) Persecutory voices/beliefs?: No Depression: Yes Depression Symptoms: Loss of interest in usual pleasures Substance abuse history and/or treatment for substance abuse?: Yes Suicide prevention information given to non-admitted patients: Not applicable  Risk to Others Homicidal Ideation: No Thoughts of Harm to Others: No Current Homicidal Intent: No Current Homicidal Plan: No Access to Homicidal Means: No Identified Victim: None  History of harm to others?: No Assessment of Violence: None Noted Violent Behavior Description: None  Does patient have access to weapons?: No Criminal Charges Pending?: No Does patient have a court date: No  Psychosis Hallucinations: None noted Delusions: None noted  Mental Status Report Appear/Hygiene: Disheveled;In scrubs Eye Contact: Fair Motor Activity: Unremarkable Speech: Logical/coherent;Slurred;Slow Level of Consciousness: Alert Mood: Other (Comment) (Appropriate ) Affect: Appropriate to circumstance Anxiety Level: None Thought Processes: Coherent;Relevant Judgement: Unimpaired Orientation: Person;Place;Time;Situation Obsessive Compulsive Thoughts/Behaviors: None  Cognitive Functioning Concentration: Decreased Memory: Recent Intact;Remote Intact IQ: Average Insight: Fair Impulse Control: Fair Appetite: Fair Weight Loss: 0 Weight Gain:  0 Sleep: Decreased Total Hours of Sleep: 5 Vegetative Symptoms: None  ADLScreening [facility] Assessment Services) Patient's cognitive ability adequate to safely complete daily activities?: Yes Patient able to express need for assistance with ADLs?: Yes Independently performs ADLs?: Yes (appropriate for developmental age)  Prior Inpatient Therapy Prior Inpatient Therapy: Yes Prior Therapy Dates: Unable to remember  Prior Therapy Facilty/Provider(s): Unable to remember  Reason for Treatment: Unable to remember   Prior Outpatient Therapy Prior Outpatient Therapy: No Prior Therapy Dates: None  Prior Therapy Facilty/Provider(s): None  Reason for Treatment: None   ADL Screening (condition at time of admission) Patient's cognitive ability adequate to safely complete daily activities?: Yes Is the patient deaf or have difficulty hearing?: No Does the patient have difficulty seeing, even when wearing glasses/contacts?: No Does the patient have difficulty concentrating, remembering, or making decisions?: Yes Patient able to express need for assistance with ADLs?: Yes Does the patient have difficulty dressing or bathing?: No Independently performs ADLs?: Yes (appropriate for developmental age) Does the patient have difficulty walking or climbing stairs?: No Weakness of Legs: None Weakness of Arms/Hands: None  Home Assistive Devices/Equipment Home Assistive Devices/Equipment: None  Therapy Consults (therapy consults require a physician order) PT Evaluation Needed: No OT Evalulation Needed: No SLP Evaluation Needed: No Abuse/Neglect Assessment (Assessment to be complete while patient is alone) Physical Abuse: Denies Verbal Abuse: Denies Sexual Abuse: Denies Exploitation of patient/patient's resources: Denies Self-Neglect: Denies Values / Beliefs Cultural Requests  During Hospitalization: None Spiritual Requests During Hospitalization: None Consults Spiritual Care Consult Needed:  No Social Work Consult Needed: No Merchant navy officerAdvance Directives (For Healthcare) Advance Directive: Patient does not have advance directive;Patient would not like information Pre-existing out of facility DNR order (yellow form or pink MOST form): No Nutrition Screen- [facility] Patient's home diet: Regular  Additional Information 1:1 In Past 12 Months?: No CIRT Risk: No Elopement Risk: No Does patient have medical clearance?: Yes     Disposition:  Disposition Initial Assessment Completed for this Encounter: Yes Disposition of Patient: Inpatient treatment program;Referred to [facility] ) Type of inpatient treatment program: Adult Patient referred to: Other (Comment) ([facility] )  Murrell ReddenSimmons, Eldine Rencher C 08/14/2013 5:48 AM

## 2013-08-14 NOTE — BH Assessment (Signed)
Pt accepted to Obs Unit bed #4 once B/P is stable at least 160/90 [facility] was addressed during bridge call this morning.   Glorious PeachNajah Presley, MS, LCASA Assessment Counselor

## 2013-08-14 NOTE — ED Notes (Signed)
MD informed of increased blood pressure.

## 2013-08-14 NOTE — Progress Notes (Signed)
Patient ID: Richard Dunn, male   DOB: 07/06/1960, 53 y.o.   MRN: [id number] [facility] VSS 134/95 [id number] O2sat 99%.  [facility] by [facility]

## 2013-08-14 NOTE — ED Provider Notes (Signed)
CSN: [id number]     Arrival date & time 08/14/13  2102 History   First MD Initiated Contact with Patient 08/14/13 2122     Chief Complaint  Patient presents with  . Angioedema   Level V caveat due to urgent need for intervention  (Consider location/radiation/quality/duration/timing/severity/associated sxs/prior Treatment) The history is provided by the patient.  [facility]. He states that a couple hours prior to arrival he had the feeling of something scratching the back of his throat. He was seen by the providers there and had increased swelling of his posterior pharynx. He was having some difficulty swallowing. He was transferred to the ER by ambulance. He recently been started back on his lisinopril. He has been coughing up some sputum.  Past Medical History  Diagnosis Date  . Hypertension   . Degenerative disc disease   . Depression    Past Surgical History  Procedure Laterality Date  . Cervical spine surgery     History reviewed. No pertinent family history. History  Substance Use Topics  . Smoking status: Current Some Day Smoker  . Smokeless tobacco: Not on file  . Alcohol Use: Yes    Review of Systems  Unable to perform ROS Constitutional: Negative for fever.  HENT: Positive for sore throat.   Respiratory: Positive for cough. Negative for shortness of breath.       Allergies  Lisinopril; Other; and Sulfonamide derivatives  Home Medications   Prior to Admission medications   Medication Sig Start Date End Date Taking? Authorizing Provider  FLUoxetine (PROZAC) 20 MG capsule Take 20 mg by mouth daily.  08/06/13  Yes Historical Provider, MD  naltrexone (DEPADE) 50 MG tablet Take 50 mg by mouth daily.  08/06/13  Yes Historical Provider, MD   BP 157/97  Pulse 80  Temp(Src) 97.7 F (36.5 C) (Oral)  Resp 11  SpO2 100% Physical Exam  Constitutional: He is oriented to person, place, and time. He appears  well-developed and well-nourished.  HENT:  Head: Normocephalic.  Patient sitting with his jaw thrust forward. He has some sputum/edema his been coughing up. View of the posterior pharynx shows large amount of edema on uvula and soft palate. Mildly muffled voice.   Eyes: Pupils are equal, round, and reactive to light.  Neck:  Some fullness of upper neck anteriorly.  Cardiovascular: Normal rate and regular rhythm.   Pulmonary/Chest: Effort normal.  Abdominal: Soft. Bowel sounds are normal.  Musculoskeletal: Normal range of motion. He exhibits no edema.  Neurological: He is alert and oriented to person, place, and time.  Skin: Skin is warm.    ED Course  Procedures (including critical care time) Labs Review Labs Reviewed  CBC WITH DIFFERENTIAL - Abnormal; Notable for the following:    MCH 34.6 (*)    Platelets 139 (*)    All other components within normal limits  COMPREHENSIVE METABOLIC PANEL - Abnormal; Notable for the following:    Chloride 95 (*)    Glucose, Bld 157 (*)    Albumin 3.3 (*)    AST 92 (*)    ALT 64 (*)    Alkaline Phosphatase 122 (*)    All other components within normal limits  RAPID STREP SCREEN  CULTURE, GROUP A STREP  TYPE AND SCREEN  PREPARE FRESH FROZEN PLASMA    Imaging Review Dg Neck Soft Tissue  08/14/2013   CLINICAL DATA:  Short of breath.  Angioedema.  EXAM: NECK SOFT TISSUES -  1+ VIEW  COMPARISON:  12/02/2012  FINDINGS: Prevertebral soft tissue swelling is present. Prevertebral soft tissues anterior to C2 measure up to 23 mm. This represents a substantial interval change compared to prior CT 12/02/2012. No destructive osseous lesions. Long segment cervical fusion that appears to extend from C3 through C7. Epiglottis grossly appears within normal limits allowing for posterior mass effect from prevertebral soft tissue swelling.  IMPRESSION: Marked prevertebral soft tissue swelling. This may be related to edema, prevertebral effusion / retropharyngeal  infection.   Electronically Signed   By: Andreas NewportGeoffrey  Lamke M.D.   On: 08/14/2013 22:17   Dg Chest Port 1 View  08/14/2013   CLINICAL DATA:  Short of breath.  Throat swelling.  EXAM: PORTABLE CHEST - 1 VIEW  COMPARISON:  06/27/2005.  FINDINGS: Cardiopericardial silhouette within normal limits. Mediastinal contours normal. Trachea midline. No airspace disease or effusion. Monitoring leads project over the chest. 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  IMPRESSION: No active disease.   Electronically Signed   By: Andreas NewportGeoffrey  Lamke M.D.   On: 08/14/2013 22:14     EKG Interpretation None      MDM   Final diagnoses:  Angioedema  Alcohol dependence    Patient with angioedema of posterior pharynx. May be related to ACE inhibitor. Patient had potential impending airway compromise. Was given steroids Pepcid Benadryl, racemic epi, and FFP. He was seen in ER by anesthesia, ENT, and critical care. He'll be admitted to the ICU. His remains grossly stable to mildly improved over his initial hour or 2 in the ER.  CRITICAL CARE Performed by: Billee CashingPICKERING,Corde Antonini R. Total critical care time: 60 minutes Critical care time was exclusive of separately billable procedures and treating other patients. Critical care was necessary to treat or prevent imminent or life-threatening deterioration. Critical care was time spent personally by me on the following activities: development of treatment plan with patient and/or surrogate as well as nursing, discussions with consultants, evaluation of patient's response to treatment, examination of patient, obtaining history from patient or surrogate, ordering and performing treatments and interventions, ordering and review of laboratory studies, ordering and review of radiographic studies, pulse oximetry and re-evaluation of patient's condition.    Juliet RudeNathan R. Rubin PayorPickering, MD 08/14/13 2342

## 2013-08-14 NOTE — Consult Note (Signed)
[facility] Face-to-Face Psychiatry Consult   Reason for Consult:  Alcohol intoxication Referring Physician:  ER MD  Richard Dunn is an 53 y.o. male. Total Time spent with patient: 45 minutes  Assessment: AXIS I:  alcohol intoxication AXIS II:  Deferred AXIS III:   Past Medical History  Diagnosis Date  . Hypertension   . Degenerative disc disease   . Depression    AXIS IV:  chronic addiction to alcohol AXIS V:  51-60 moderate symptoms  Plan:  detox via OBS bed  Subjective:   Richard Dunn is a 53 y.o. male patient admitted with alcohol intoxication.  HPI:  Richard Dunn was intoxicated when he came in and is sober now.  He is not suicidal or homicidal but will need detox.  No other psychiatric issues he says. Says he drinks 2 to 3 fifths of wine daily and has been drinking this time about one year after 3 years sobriety. HPI Elements:   Location:  intoxication. Quality:  having withdrawal symptoms . Severity:  shakes, sweating, anxious, irritable. Timing:  drinking heavily. Duration:  one year this episode. Context:  as above.  Past Psychiatric History: Past Medical History  Diagnosis Date  . Hypertension   . Degenerative disc disease   . Depression     reports that he has been smoking.  He does not have any smokeless tobacco history on file. He reports that he drinks alcohol. He reports that he does not use illicit drugs. History reviewed. No pertinent family history. Family History Substance Abuse: No Family Supports: No Living Arrangements: Alone Can pt return to current living arrangement?: Yes Abuse/Neglect [facility]) Physical Abuse: Denies Verbal Abuse: Denies Sexual Abuse: Denies Allergies:   Allergies  Allergen Reactions  . Sulfonamide Derivatives Other (See Comments)    Childhood allergy    ACT Assessment Complete:  Yes:    Educational Status    Risk to Self: Risk to self Suicidal Ideation: No Suicidal Intent: No Is patient at risk for suicide?: No Suicidal  Plan?: No Access to Means: No What has been your use of drugs/alcohol within the last 12 months?: Abusing: alcohil  Previous Attempts/Gestures: No How many times?: 0 Other Self Harm Risks: None  Triggers for Past Attempts: None known Intentional Self Injurious Behavior: None Family Suicide History: No Recent stressful life event(s): Other (Comment) (Chronic SA ) Persecutory voices/beliefs?: No Depression: Yes Depression Symptoms: Loss of interest in usual pleasures Substance abuse history and/or treatment for substance abuse?: Yes Suicide prevention information given to non-admitted patients: Not applicable  Risk to Others: Risk to Others Homicidal Ideation: No Thoughts of Harm to Others: No Current Homicidal Intent: No Current Homicidal Plan: No Access to Homicidal Means: No Identified Victim: None  History of harm to others?: No Assessment of Violence: None Noted Violent Behavior Description: None  Does patient have access to weapons?: No Criminal Charges Pending?: No Does patient have a court date: No  Abuse: Abuse/Neglect Assessment (Assessment to be complete while patient is alone) Physical Abuse: Denies Verbal Abuse: Denies Sexual Abuse: Denies Exploitation of patient/patient's resources: Denies Self-Neglect: Denies  Prior Inpatient Therapy: Prior Inpatient Therapy Prior Inpatient Therapy: Yes Prior Therapy Dates: Unable to remember  Prior Therapy Facilty/Brando Taves(s): Unable to remember  Reason for Treatment: Unable to remember   Prior Outpatient Therapy: Prior Outpatient Therapy Prior Outpatient Therapy: No Prior Therapy Dates: None  Prior Therapy Facilty/Laneshia Pina(s): None  Reason for Treatment: None   Additional Information: Additional Information 1:1 In Past 12 Months?: No CIRT Risk:  No Elopement Risk: No Does patient have medical clearance?: Yes                  Objective: Blood pressure 194/106, pulse 69, temperature 97.5 F (36.4 C),  temperature source Oral, resp. rate 18, SpO2 98.00%.There is no weight on file to calculate BMI. Results for orders placed during the hospital encounter of 08/13/13 (from the past 72 hour(s))  CBC WITH DIFFERENTIAL     Status: Abnormal   Collection Time    08/13/13  2:49 PM      Result Value Ref Range   WBC 5.6  4.0 - 10.5 K/uL   RBC 4.32  4.22 - 5.81 MIL/uL   Hemoglobin 14.9  13.0 - 17.0 g/dL   HCT 42.3  39.0 - 52.0 %   MCV 97.9  78.0 - 100.0 fL   MCH 34.5 (*) 26.0 - 34.0 pg   MCHC 35.2  30.0 - 36.0 g/dL   RDW 12.9  11.5 - 15.5 %   Platelets 148 (*) 150 - 400 K/uL   Neutrophils Relative % 69  43 - 77 %   Neutro Abs 3.9  1.7 - 7.7 K/uL   Lymphocytes Relative 18  12 - 46 %   Lymphs Abs 1.0  0.7 - 4.0 K/uL   Monocytes Relative 10  3 - 12 %   Monocytes Absolute 0.5  0.1 - 1.0 K/uL   Eosinophils Relative 1  0 - 5 %   Eosinophils Absolute 0.0  0.0 - 0.7 K/uL   Basophils Relative 2 (*) 0 - 1 %   Basophils Absolute 0.1  0.0 - 0.1 K/uL  COMPREHENSIVE METABOLIC PANEL     Status: Abnormal   Collection Time    08/13/13  2:49 PM      Result Value Ref Range   Sodium 143  137 - 147 mEq/L   Comment: REPEATED TO VERIFY   Potassium 2.9 (*) 3.7 - 5.3 mEq/L   Comment: REPEATED TO VERIFY     CRITICAL RESULT CALLED TO, READ BACK BY AND VERIFIED WITH:     Mikey College RN [phone] 08/13/2013 BY BOVELL,T.   Chloride 102  96 - 112 mEq/L   Comment: REPEATED TO VERIFY   CO2 20  19 - 32 mEq/L   Comment: REPEATED TO VERIFY   Glucose, Bld 136 (*) 70 - 99 mg/dL   BUN 4 (*) 6 - 23 mg/dL   Creatinine, Ser 0.52  0.50 - 1.35 mg/dL   Calcium 8.8  8.4 - 10.5 mg/dL   Total Protein 7.6  6.0 - 8.3 g/dL   Albumin 3.4 (*) 3.5 - 5.2 g/dL   AST 132 (*) 0 - 37 U/L   ALT 77 (*) 0 - 53 U/L   Alkaline Phosphatase 126 (*) 39 - 117 U/L   Total Bilirubin 0.4  0.3 - 1.2 mg/dL   GFR calc non Af Amer >90  >90 mL/min   GFR calc Af Amer >90  >90 mL/min   Comment: (NOTE)     The eGFR has been calculated using the CKD EPI  equation.     This calculation has not been validated in all clinical situations.     eGFR's persistently <90 mL/min signify possible Chronic Kidney     Disease.  ETHANOL     Status: Abnormal   Collection Time    08/13/13  2:49 PM      Result Value Ref Range   Alcohol, Ethyl (B) >498 (*) 0 -  11 mg/dL   Comment:            LOWEST DETECTABLE LIMIT FOR     SERUM ALCOHOL IS 11 mg/dL     FOR MEDICAL PURPOSES ONLY     CRITICAL RESULT CALLED TO, READ BACK BY AND VERIFIED WITH:     Mikey College RN [phone] 08/13/2013 BY BOVELL,T.  URINE RAPID DRUG SCREEN (HOSP PERFORMED)     Status: Abnormal   Collection Time    08/13/13  6:10 PM      Result Value Ref Range   Opiates NONE DETECTED  NONE DETECTED   Cocaine NONE DETECTED  NONE DETECTED   Benzodiazepines NONE DETECTED  NONE DETECTED   Amphetamines NONE DETECTED  NONE DETECTED   Tetrahydrocannabinol POSITIVE (*) NONE DETECTED   Barbiturates NONE DETECTED  NONE DETECTED   Comment:            DRUG SCREEN FOR MEDICAL PURPOSES     ONLY.  IF CONFIRMATION IS NEEDED     FOR ANY PURPOSE, NOTIFY LAB     WITHIN 5 DAYS.                LOWEST DETECTABLE LIMITS     FOR URINE DRUG SCREEN     Drug Class       Cutoff (ng/mL)     Amphetamine      1000     Barbiturate      200     Benzodiazepine   536     Tricyclics       644     Opiates          300     Cocaine          300     THC              50  BASIC METABOLIC PANEL     Status: Abnormal   Collection Time    08/13/13  8:15 PM      Result Value Ref Range   Sodium 143  137 - 147 mEq/L   Potassium 3.7  3.7 - 5.3 mEq/L   Comment: DELTA CHECK NOTED     NO VISIBLE HEMOLYSIS   Chloride 102  96 - 112 mEq/L   CO2 22  19 - 32 mEq/L   Glucose, Bld 145 (*) 70 - 99 mg/dL   BUN 3 (*) 6 - 23 mg/dL   Creatinine, Ser 0.50  0.50 - 1.35 mg/dL   Calcium 8.3 (*) 8.4 - 10.5 mg/dL   GFR calc non Af Amer >90  >90 mL/min   GFR calc Af Amer >90  >90 mL/min   Comment: (NOTE)     The eGFR has been calculated using the  CKD EPI equation.     This calculation has not been validated in all clinical situations.     eGFR's persistently <90 mL/min signify possible Chronic Kidney     Disease.   Labs are reviewed and are pertinent for BAL of 498 and presence of THC.  Current Facility-Administered Medications  Medication Dose Route Frequency Chianne Byrns Last Rate Last Dose  . cloNIDine (CATAPRES) tablet 0.2 mg  0.2 mg Oral BID Julianne Rice, MD   0.2 mg at 08/14/13 1003  . FLUoxetine (PROZAC) capsule 20 mg  20 mg Oral Daily Orpah Greek, MD   20 mg at 08/14/13 1003  . lisinopril (PRINIVIL,ZESTRIL) tablet 10 mg  10 mg Oral Daily Julianne Rice, MD   10 mg  at 08/14/13 0754  . LORazepam (ATIVAN) tablet 0-4 mg  0-4 mg Oral 4 times per day Julianne Rice, MD   1 mg at 08/14/13 0800   Followed by  . [START ON 08/16/2013] LORazepam (ATIVAN) tablet 0-4 mg  0-4 mg Oral Q12H Julianne Rice, MD      . nicotine (NICODERM CQ - dosed in mg/24 hours) patch 21 mg  21 mg Transdermal Once Shuvon Rankin, NP   21 mg at 08/14/13 1017   Current Outpatient Prescriptions  Medication Sig Dispense Refill  . FLUoxetine (PROZAC) 20 MG capsule Take 20 mg by mouth daily.       . naltrexone (DEPADE) 50 MG tablet Take 50 mg by mouth daily.         Psychiatric Specialty Exam:     Blood pressure 194/106, pulse 69, temperature 97.5 F (36.4 C), temperature source Oral, resp. rate 18, SpO2 98.00%.There is no weight on file to calculate BMI.  General Appearance: Casual  Eye Contact::  Good  Speech:  Clear and Coherent  Volume:  Normal  Mood:  Anxious  Affect:  Appropriate  Thought Process:  Coherent  Orientation:  Full (Time, Place, and Person)  Thought Content:  Negative  Suicidal Thoughts:  No  Homicidal Thoughts:  No  Memory:  Immediate;   Good Recent;   Good Remote;   Good  Judgement:  Intact  Insight:  Fair  Psychomotor Activity:  Normal  Concentration:  Good  Recall:  Good  Fund of Knowledge:Good  Language: Good   Akathisia:  Negative  Handed:  Right  AIMS (if indicated):     Assets:  Communication Skills Desire for Improvement  Sleep:      Musculoskeletal: Strength & Muscle Tone: within normal limits Gait & Station: normal Patient leans: N/A  Treatment Plan Summary: Has been accepted to [facility] OBS bed for detox  TAYLOR,GERALD D 08/14/2013 12:21 PM

## 2013-08-14 NOTE — Progress Notes (Signed)
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 [facility] for eval /treatment 2. IV Decadron 3. Airway support  Patient seen, evaluated and I agree with notes by Nurse Practitioner. Thedore MinsMojeed Akintayo, MD

## 2013-08-14 NOTE — Progress Notes (Signed)
Patient ID: Richard Dunn, male   DOB: 03/31/1960, 53 y.o.   MRN: [id number] Admission Note-[facility]. He presented for detox from alcohol and uses marijuana daily. Drinking 2-3 5 ths of liquor daily and when that is not available drinks Listerine. Has been drinking at this rate for a year. Has had a history of 8 years of sobriety.Lives alone now but had been allowing several "friends" to live with him but because he is trying to get sober had them all leave recently.Is tremulous, palms are sweaty, anxious and BP elevated. His last drink was yesterday. He denies any thoughts to hurt self or others, and is not psychotic.He has been drinking on and off since he was 53 yo.He has a history of hypertension.He has had weight loss of an unknown amount since he has been drinking over eating. 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 Ate once on unit and given Ativan 2mg  per order for his CIWA of 13. Pleasant and cooperative. He would like to be admitted for 3-5 days to complete detox.

## 2013-08-14 NOTE — Consult Note (Signed)
Review of Systems   Constitutional: Negative.    HENT: Negative.    Eyes: Negative.    Respiratory: Negative.    Cardiovascular: Negative.    Gastrointestinal: Negative.    Genitourinary: Negative.    Musculoskeletal: Negative.    Skin: Negative.    Neurological: Negative.    Endo/Heme/Allergies: Negative.    Psychiatric/Behavioral: Positive for substance abuse.

## 2013-08-14 NOTE — Progress Notes (Signed)
Patient ID: Richard BrownsJames Piper, male   DOB: 01/31/1961, 53 y.o.   MRN: [id number] Pt alert and oriented. Denies SI/HI, -A/[place] verbally contracts for safety. Denies pain but complains of throat discomfort and the feeling something is "hanging down" frequent coughing noted. Karleen HampshireSpencer NP notified. Will continue to monitor and evaluate for stabilization.

## 2013-08-15 ENCOUNTER — Inpatient Hospital Stay (HOSPITAL_COMMUNITY): Payer: PRIVATE HEALTH INSURANCE

## 2013-08-15 ENCOUNTER — Encounter (HOSPITAL_COMMUNITY): Payer: Self-pay | Admitting: *Deleted

## 2013-08-15 ENCOUNTER — Other Ambulatory Visit: Payer: Self-pay

## 2013-08-15 DIAGNOSIS — G9340 Encephalopathy, unspecified: Secondary | ICD-10-CM

## 2013-08-15 DIAGNOSIS — Z09 Encounter for follow-up examination after completed treatment for conditions other than malignant neoplasm: Secondary | ICD-10-CM

## 2013-08-15 LAB — BLOOD GAS, ARTERIAL
Acid-base deficit: 0.2 mmol/L (ref 0.0–2.0)
Bicarbonate: 23.1 mEq/L (ref 20.0–24.0)
DRAWN BY: 257701
FIO2: 1 %
LHR: 16 {breaths}/min
O2 SAT: 99.9 %
PATIENT TEMPERATURE: 98.6
PEEP: 5 cmH2O
TCO2: 20.2 mmol/L (ref 0–100)
VT: 600 mL
pCO2 arterial: 35.3 mmHg (ref 35.0–45.0)
pH, Arterial: 7.432 (ref 7.350–7.450)
pO2, Arterial: 542 mmHg — ABNORMAL HIGH (ref 80.0–100.0)

## 2013-08-15 LAB — GLUCOSE, CAPILLARY
GLUCOSE-CAPILLARY: 114 mg/dL — AB (ref 70–99)
GLUCOSE-CAPILLARY: 179 mg/dL — AB (ref 70–99)
GLUCOSE-CAPILLARY: 176 mg/dL — AB (ref 70–99)
Glucose-Capillary: 155 mg/dL — ABNORMAL HIGH (ref 70–99)
Glucose-Capillary: 173 mg/dL — ABNORMAL HIGH (ref 70–99)
Glucose-Capillary: 166 mg/dL — ABNORMAL HIGH (ref 70–99)
Glucose-Capillary: 150 mg/dL — ABNORMAL HIGH (ref 70–99)

## 2013-08-15 LAB — BASIC METABOLIC PANEL
BUN: 7 mg/dL (ref 6–23)
CO2: 25 mEq/L (ref 19–32)
Calcium: 8.5 mg/dL (ref 8.4–10.5)
Chloride: 106 mEq/L (ref 96–112)
Creatinine, Ser: 0.55 mg/dL (ref 0.50–1.35)
Glucose, Bld: 119 mg/dL — ABNORMAL HIGH (ref 70–99)
POTASSIUM: 3.8 meq/L (ref 3.7–5.3)
SODIUM: 141 meq/L (ref 137–147)

## 2013-08-15 LAB — CBC
HCT: 44.1 % (ref 39.0–52.0)
Hemoglobin: 15.2 g/dL (ref 13.0–17.0)
MCH: 34.4 pg — ABNORMAL HIGH (ref 26.0–34.0)
MCHC: 34.5 g/dL (ref 30.0–36.0)
MCV: 99.8 fL (ref 78.0–100.0)
PLATELETS: 122 10*3/uL — AB (ref 150–400)
RBC: 4.42 MIL/uL (ref 4.22–5.81)
RDW: 12.7 % (ref 11.5–15.5)
WBC: 7 10*3/uL (ref 4.0–10.5)

## 2013-08-15 LAB — PHOSPHORUS: PHOSPHORUS: 1.7 mg/dL — AB (ref 2.3–4.6)

## 2013-08-15 LAB — MRSA PCR SCREENING: MRSA by PCR: NEGATIVE

## 2013-08-15 LAB — ABO/RH: ABO/RH(D): A POS

## 2013-08-15 LAB — TRIGLYCERIDES: Triglycerides: 104 mg/dL (ref <150)

## 2013-08-15 LAB — MAGNESIUM
Magnesium: 1.4 mg/dL — ABNORMAL LOW (ref 1.5–2.5)
Magnesium: 2.2 mg/dL (ref 1.5–2.5)

## 2013-08-15 LAB — TROPONIN I: Troponin I: <0.3 ng/mL (ref <0.30)

## 2013-08-15 MED ORDER — MIDAZOLAM HCL 2 MG/2ML IJ SOLN
INTRAMUSCULAR | Status: AC
  Filled 2013-08-15: qty 4

## 2013-08-15 MED ORDER — DEXMEDETOMIDINE HCL IN NACL 400 MCG/100ML IV SOLN: 0.4000 ug/kg/h | INTRAVENOUS | Status: DC

## 2013-08-15 MED ORDER — ROCURONIUM BROMIDE 50 MG/5ML IV SOLN
1.0000 mg/kg | Freq: Once | INTRAVENOUS | Status: AC
  Administered 2013-08-15: 63 mg via INTRAVENOUS
  Filled 2013-08-15: qty 6.3

## 2013-08-15 MED ORDER — METOPROLOL TARTRATE 25 MG PO TABS
50.0000 mg | ORAL_TABLET | Freq: Two times a day (BID) | ORAL | Status: DC
  Administered 2013-08-15: 50 mg via ORAL
  Filled 2013-08-15: qty 2

## 2013-08-15 MED ORDER — LORAZEPAM 2 MG/ML IJ SOLN
2.0000 mg | INTRAMUSCULAR | Status: DC | PRN
  Filled 2013-08-15: qty 1

## 2013-08-15 MED ORDER — MIDAZOLAM HCL 2 MG/2ML IJ SOLN
2.0000 mg | INTRAMUSCULAR | Status: DC | PRN
  Administered 2013-08-16: 2 mg via INTRAVENOUS
  Filled 2013-08-15: qty 2

## 2013-08-15 MED ORDER — MAGNESIUM SULFATE 4000MG/100ML IJ SOLN
4.0000 g | Freq: Once | INTRAMUSCULAR | Status: AC
  Administered 2013-08-15: 4 g via INTRAVENOUS
  Filled 2013-08-15: qty 100

## 2013-08-15 MED ORDER — FENTANYL CITRATE 0.05 MG/ML IJ SOLN
100.0000 ug | INTRAMUSCULAR | Status: DC | PRN
  Administered 2013-08-15 – 2013-08-16 (×2): 100 ug via INTRAVENOUS

## 2013-08-15 MED ORDER — PROPOFOL 10 MG/ML IV EMUL
INTRAVENOUS | Status: AC
  Filled 2013-08-15: qty 100

## 2013-08-15 MED ORDER — FENTANYL CITRATE 0.05 MG/ML IJ SOLN
INTRAMUSCULAR | Status: AC
  Administered 2013-08-15: 100 ug
  Filled 2013-08-15: qty 2

## 2013-08-15 MED ORDER — PROPOFOL 10 MG/ML IV EMUL
5.0000 ug/kg/min | INTRAVENOUS | Status: DC
  Administered 2013-08-15: 40 ug/kg/min via INTRAVENOUS
  Administered 2013-08-15: 70 ug/kg/min via INTRAVENOUS
  Administered 2013-08-15: 40 ug/kg/min via INTRAVENOUS
  Administered 2013-08-16 (×2): 60 ug/kg/min via INTRAVENOUS
  Administered 2013-08-16: 45 ug/kg/min via INTRAVENOUS
  Administered 2013-08-16: 55 ug/kg/min via INTRAVENOUS
  Administered 2013-08-16: 50 ug/kg/min via INTRAVENOUS
  Administered 2013-08-16: 45 ug/kg/min via INTRAVENOUS
  Administered 2013-08-17: 70 ug/kg/min via INTRAVENOUS
  Administered 2013-08-17: 50 ug/kg/min via INTRAVENOUS
  Filled 2013-08-15 (×5): qty 100
  Filled 2013-08-15: qty 200
  Filled 2013-08-15 (×2): qty 100

## 2013-08-15 MED ORDER — MIDAZOLAM HCL 2 MG/2ML IJ SOLN
4.0000 mg | Freq: Once | INTRAMUSCULAR | Status: AC
  Administered 2013-08-15: 4 mg via INTRAVENOUS

## 2013-08-15 MED ORDER — DEXMEDETOMIDINE HCL IN NACL 200 MCG/50ML IV SOLN
0.4000 ug/kg/h | INTRAVENOUS | Status: DC
  Administered 2013-08-15: 0.6 ug/kg/h via INTRAVENOUS
  Filled 2013-08-15 (×2): qty 50

## 2013-08-15 MED ORDER — SODIUM CHLORIDE 0.9 % IV BOLUS (SEPSIS)
1000.0000 mL | Freq: Once | INTRAVENOUS | Status: AC
  Administered 2013-08-15: 1000 mL via INTRAVENOUS

## 2013-08-15 MED ORDER — PROPOFOL 10 MG/ML IV EMUL
INTRAVENOUS | Status: AC
Start: 2013-08-15 — End: 2013-08-15
  Filled 2013-08-15: qty 100

## 2013-08-15 MED ORDER — DEXTROSE 5 % IV SOLN
20.0000 mmol | Freq: Once | INTRAVENOUS | Status: AC
  Administered 2013-08-15: 20 mmol via INTRAVENOUS
  Filled 2013-08-15: qty 6.67

## 2013-08-15 MED ORDER — ETOMIDATE 2 MG/ML IV SOLN
0.3000 mg/kg | Freq: Once | INTRAVENOUS | Status: AC
  Administered 2013-08-15: 20 mg via INTRAVENOUS
  Filled 2013-08-15: qty 9.45

## 2013-08-15 MED ORDER — PANTOPRAZOLE SODIUM 40 MG IV SOLR
40.0000 mg | Freq: Every day | INTRAVENOUS | Status: DC
  Administered 2013-08-15: 40 mg via INTRAVENOUS
  Filled 2013-08-15: qty 40

## 2013-08-15 MED ORDER — FENTANYL CITRATE 0.05 MG/ML IJ SOLN
100.0000 ug | INTRAMUSCULAR | Status: DC | PRN
  Filled 2013-08-15 (×4): qty 2

## 2013-08-15 MED ORDER — FENTANYL CITRATE 0.05 MG/ML IJ SOLN
INTRAMUSCULAR | Status: AC
  Filled 2013-08-15: qty 2

## 2013-08-15 MED ORDER — FENTANYL CITRATE 0.05 MG/ML IJ SOLN: 100.0000 ug | INTRAMUSCULAR | Status: DC | PRN

## 2013-08-15 MED ORDER — LORAZEPAM 2 MG/ML IJ SOLN
4.0000 mg | Freq: Once | INTRAMUSCULAR | Status: AC
  Administered 2013-08-15: 4 mg via INTRAVENOUS

## 2013-08-15 MED ORDER — MIDAZOLAM HCL 2 MG/2ML IJ SOLN: 2.0000 mg | INTRAMUSCULAR | Status: DC | PRN

## 2013-08-15 NOTE — Plan of Care (Signed)
Problem: Phase I Progression Outcomes Goal: OOB as tolerated unless otherwise ordered Outcome: Not Progressing Patient still has tightness in his throat and hoarse voice.

## 2013-08-15 NOTE — Procedures (Signed)
STAFF note  Supervised procedure. Real time 2D ultrasound used for vein site selection, patency assessment, and needle entry./ A record of image was made but could not be submitted for filing due to malfunction of printing device   Dr. Kalman ShanMurali Ramaswamy, M.D., [facility].C.P Pulmonary and Critical Care Medicine Staff Physician [facility] Pulmonary and Critical Care Pager: [phone], If no answer or between  15:00h - 7:00h: call [phone]  08/15/2013 6:02 PM

## 2013-08-15 NOTE — Progress Notes (Signed)
Per PA order- PT placed on Face Tent (28% Fi02)- PT tolerating well at this time. Sp02 100%, RR 19, HR 78.

## 2013-08-15 NOTE — Progress Notes (Signed)
CARE MANAGEMENT NOTE 08/15/2013  Patient:  Balon,Link   Account Number:  [id number]  Date Initiated:  08/15/2013  Documentation initiated by:  DAVIS,RHONDA  Subjective/Objective Assessment:   53 yo male with hx of ETOH presented to behavioral health for alcohol detox.  He was given lisinopril for hypertension.  Developed throat swelling and difficulty breathing     Action/Plan:   bhh when stable   Anticipated DC Date:  08/18/2013   Anticipated DC Plan:  [facility]  In-house referral  Clinical Social Worker      DC Planning Services  NA      [facility]  NA   Choice offered to / List presented to:  NA      DME agency  NA     HH arranged  NA      HH agency  NA   Status of service:  In process, will continue to follow Medicare Important Message given?  NA - LOS <3 / Initial given by admissions (If response is "NO", the following Medicare IM given date fields will be blank) Date Medicare IM given:   Date Additional Medicare IM given:    Discharge Disposition:    Per UR Regulation:  Reviewed for med. necessity/level of care/duration of stay  If discussed at Long Length of Stay Meetings, dates discussed:    Comments:  [id number]/Rhonda Earlene Plateravis, RN, BSN, CCM: Chart review for discharge needs: No needs present at time of this review. Next review due on [id number].

## 2013-08-15 NOTE — Procedures (Signed)
Patient has severe agitated delirium and had to be emergently intubated under my supervision  Dr. Kalman ShanMurali Ramaswamy, M.D., [facility] Center[facility].C.P Pulmonary and Critical Care Medicine Staff Physician [facility] Pulmonary and Critical Care Pager: [phone], If no answer or between  15:00h - 7:00h: call [phone]  08/15/2013 6:02 PM

## 2013-08-15 NOTE — Progress Notes (Signed)
PULMONARY / CRITICAL CARE MEDICINE   Name: Richard BrownsJames Dunn MRN: [id number] DOB: 02/09/1961    ADMISSION DATE:  08/14/2013  REFERRING MD : Benjiman CoreNathan Pickering  CHIEF COMPLAINT:  Difficulty swallowing  BRIEF PATIENT DESCRIPTION:  53 yo male with hx of ETOH presented to behavioral health for alcohol detox.  He was given lisinopril for hypertension.  Developed throat swelling and difficulty breathing.  PCCM asked to admit to ICU.  SIGNIFICANT EVENTS: 6/17 Admit, ENT evaluation in ER  STUDIES:  6/17 Bedside laryngoscopy >> palatal/supraglottic edema, vocal cords mobile  LINES / TUBES: PIV   SUBJECTIVE:  Anxious  VITAL SIGNS: Temp:  [97.5 F (36.4 C)-98.1 F (36.7 C)] 97.7 F (36.5 C) (06/18 0450) Pulse Rate:  [59-88] 65 (06/18 0450) Resp:  [11-21] 12 (06/18 0450) BP: (137-194)/(84-107) 161/104 mmHg (06/18 0450) SpO2:  [97 %-100 %] 100 % (06/18 0450) Weight:  [63 kg (138 lb 14.2 oz)] 63 kg (138 lb 14.2 oz) (06/18 0033) Room air  INTAKE / OUTPUT: Intake/Output     06/17 0701 - 06/18 0700 06/18 0701 - 06/19 0700   I.V. (mL/kg) 750 (11.9)    Blood 583    IV Piggyback 50    Total Intake(mL/kg) 1383 (22)    Net +1383            PHYSICAL EXAMINATION: General: not using accessory muscles Neuro:  Alert, follows commands, normal strength, mild tremor, a little agitated  HEENT:  Pupils reactive, garbled speech, no tongue swelling, no LAN, no stridor Cardiovascular:  Regular, tachycardic Lungs:  No wheeze/rales Abdomen:  Soft, non tender, + bowel sounds Musculoskeletal:  No edema Skin:  Sweaty, no rashes  LABS:  CBC  Recent Labs Lab 08/13/13 1449 08/14/13 2230 08/15/13 0312  WBC 5.6 7.2 7.0  HGB 14.9 15.7 15.2  HCT 42.3 44.8 44.1  PLT 148* 139* 122*   BMET  Recent Labs Lab 08/13/13 2015 08/14/13 1432 08/14/13 2230  NA 143 136* 137  K 3.7 4.0 4.2  CL 102 95* 95*  CO2 22 24 26   BUN 3* 5* 10  CREATININE 0.50 0.44* 0.62  GLUCOSE 145* 181* 157*    Electrolytes  Recent Labs Lab 08/13/13 2015 08/14/13 1432 08/14/13 2230 08/15/13 0312  CALCIUM 8.3* 9.1 9.0  --   MG  --   --   --  1.4*  PHOS  --   --   --  1.7*   Liver Enzymes  Recent Labs Lab 08/13/13 1449 08/14/13 1432 08/14/13 2230  AST 132* 104* 92*  ALT 77* 70* 64*  ALKPHOS 126* 125* 122*  BILITOT 0.4 1.0 1.2  ALBUMIN 3.4* 3.3* 3.3*   Glucose  Recent Labs Lab 08/15/13 0134 08/15/13 0422 08/15/13 0727  GLUCAP 155* 114* 179*    Imaging Dg Neck Soft Tissue  08/14/2013   CLINICAL DATA:  Short of breath.  Angioedema.  EXAM: NECK SOFT TISSUES - 1+ VIEW  COMPARISON:  12/02/2012  FINDINGS: Prevertebral soft tissue swelling is present. Prevertebral soft tissues anterior to C2 measure up to 23 mm. This represents a substantial interval change compared to prior CT 12/02/2012. No destructive osseous lesions. Long segment cervical fusion that appears to extend from C3 through C7. Epiglottis grossly appears within normal limits allowing for posterior mass effect from prevertebral soft tissue swelling.  IMPRESSION: Marked prevertebral soft tissue swelling. This may be related to edema, prevertebral effusion / retropharyngeal infection.   Electronically Signed   By: Andreas NewportGeoffrey  Lamke M.D.   On: 08/14/2013 22:17  Dg Chest Port 1 View  08/15/2013   CLINICAL DATA:  Cough and congestion.  History of smoking.  EXAM: PORTABLE CHEST - 1 VIEW  COMPARISON:  Chest radiograph performed 08/14/2013  FINDINGS: The lungs are well-aerated and clear. There is no evidence of focal opacification, pleural effusion or pneumothorax.  The cardiomediastinal silhouette is within normal limits. No acute osseous abnormalities are seen. Degenerative change is noted at the right humeral head, with prominent osteophyte formation. Cervical spinal fusion hardware is partially imaged.  IMPRESSION: No acute cardiopulmonary process seen.   Electronically Signed   By: Roanna RaiderJeffery  Chang M.D.   On: 08/15/2013 00:44   Dg  Chest Port 1 View  08/14/2013   CLINICAL DATA:  Short of breath.  Throat swelling.  EXAM: PORTABLE CHEST - 1 VIEW  COMPARISON:  06/27/2005.  FINDINGS: Cardiopericardial silhouette within normal limits. Mediastinal contours normal. Trachea midline. No airspace disease or effusion. Monitoring leads project over the chest. 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  IMPRESSION: No active disease.   Electronically Signed   By: Andreas NewportGeoffrey  Lamke M.D.   On: 08/14/2013 22:14    ASSESSMENT / PLAN:  PULMONARY A: Acute respiratory distress 2nd to angioedema most likely from ACE inhibitor > looks better but still w/ difficulty talking.  P:   Keep in ICU Continue solumedrol, benadryl, pepcid Humidified Face mask  Oxygen to keep SpO2 > 92% PRN albuterol  CARDIOVASCULAR A:  Sinus tachycardia 2nd to ETOH withdrawal  Hx of HTN. P:  Listed ACE inhibitor as allergy PRN labetalol Add scheduled BB  RENAL A:   Hypomagnesemia  Hypophosphatemia  P:   F/u and replace electrolytes as needed  GASTROINTESTINAL A:   Elevated LFT's 2nd to ETOH. Nutrition. P:   NPO-->add cl liquid  F/u LFT's intermittently Thiamine, folic acid Dextrose in IV fluid  HEMATOLOGIC A:   Thrombocytopenia likely 2nd to ETOH. P:  F/u CBC SQ heparin for DVT prevention  INFECTIOUS A:   No evidence for infection. P:   Monitor for evidence of aspiration  ENDOCRINE A:   Steroid induced hyperglycemia.   P:   SSI while on steroids  NEUROLOGIC A:   Alcohol withdrawal. Hx of depression. P:   Ativan prn  Add precedex   No distress but anxious. Still w/ garbled course upper airway noises.

## 2013-08-15 NOTE — Progress Notes (Signed)
STAFF NOTE: I, Dr Lavinia SharpsM Ramaswamy have personally reviewed patient's available data, including medical history, events of note, physical examination and test results as part of my evaluation. I have discussed with resident/NP and other care providers such as pharmacist, RN and RRT.  In addition,  I personally evaluated patient and elicited key findings of acute encephalopathy due to etoh WD/delirum. Currently RASS +4 needing several sitters despite max precedex and ativan4mg  IV bolus. Will give fentanyl. Likely needs intubation. AT this point I do not see Upper airway edema. He is edentulous with mallampatti 2. No stridor.   Rest per NP/medical resident whose note is outlined above and that I agree with  The patient is critically ill with multiple organ systems failure and requires high complexity decision making for assessment and support, frequent evaluation and titration of therapies, application of advanced monitoring technologies and extensive interpretation of multiple databases.   Critical Care Time devoted to patient care services described in this note is  35  Minutes.  Dr. Kalman ShanMurali Ramaswamy, M.D., [facility].C.C.P Pulmonary and Critical Care Medicine Staff Physician [facility] Pulmonary and Critical Care Pager: [phone], If no answer or between  15:00h - 7:00h: call [phone]  08/15/2013 11:43 AM

## 2013-08-15 NOTE — Procedures (Signed)
Intubation Procedure Note Richard Dunn [id number] 04/17/1960  Procedure: Intubation Indications: Airway protection and maintenance  Procedure Details Consent: not obtained. Emergent and pt had AMS Time Out: Verified patient identification, verified procedure, site/side was marked, verified correct patient position, special equipment/implants available, medications/allergies/relevent history reviewed, required imaging and test results available.  Performed  Maximum sterile technique was used including antiseptics, cap, gloves, gown, hand hygiene and mask.  MAC 3 glide scope # 7 tube    Evaluation Hemodynamic Status: BP stable throughout; O2 sats: stable throughout Patient's Current Condition: stable Complications: No apparent complications Patient did tolerate procedure well. Chest X-ray ordered to verify placement.  CXR: pending.   BABCOCK,PETE 08/15/2013

## 2013-08-15 NOTE — Procedures (Signed)
Central Venous Catheter Insertion Procedure Note Hoover BrownsJames Bowermaster [id number] 04/07/1960  Procedure: Insertion of Central Venous Catheter Indications: Assessment of intravascular volume, Drug and/or fluid administration and Frequent blood sampling  Procedure Details Consent: Unable to obtain consent because of emergent medical necessity. Time Out: Verified patient identification, verified procedure, site/side was marked, verified correct patient position, special equipment/implants available, medications/allergies/relevent history reviewed, required imaging and test results available.  Performed  Maximum sterile technique was used including antiseptics, cap, gloves, gown, hand hygiene, mask and sheet. Skin prep: Chlorhexidine; local anesthetic administered A antimicrobial bonded/coated triple lumen catheter was placed in the right internal jugular vein using the Seldinger technique.  Evaluation Blood flow good Complications: No apparent complications Patient did tolerate procedure well. Chest X-ray ordered to verify placement.  CXR: pending.  BABCOCK,PETE 08/15/2013, 1:02 PM

## 2013-08-16 ENCOUNTER — Inpatient Hospital Stay (HOSPITAL_COMMUNITY): Payer: PRIVATE HEALTH INSURANCE

## 2013-08-16 DIAGNOSIS — J9601 Acute respiratory failure with hypoxia: Secondary | ICD-10-CM

## 2013-08-16 DIAGNOSIS — F10931 Alcohol use, unspecified with withdrawal delirium: Secondary | ICD-10-CM

## 2013-08-16 DIAGNOSIS — F10231 Alcohol dependence with withdrawal delirium: Secondary | ICD-10-CM

## 2013-08-16 DIAGNOSIS — J9600 Acute respiratory failure, unspecified whether with hypoxia or hypercapnia: Secondary | ICD-10-CM

## 2013-08-16 LAB — PREPARE FRESH FROZEN PLASMA
UNIT DIVISION: 0
Unit division: 0

## 2013-08-16 LAB — COMPREHENSIVE METABOLIC PANEL
ALT: 43 U/L (ref 0–53)
AST: 49 U/L — AB (ref 0–37)
Albumin: 2.8 g/dL — ABNORMAL LOW (ref 3.5–5.2)
Alkaline Phosphatase: 84 U/L (ref 39–117)
BUN: 6 mg/dL (ref 6–23)
CALCIUM: 8.4 mg/dL (ref 8.4–10.5)
CO2: 23 mEq/L (ref 19–32)
Chloride: 108 mEq/L (ref 96–112)
Creatinine, Ser: 0.54 mg/dL (ref 0.50–1.35)
GFR calc non Af Amer: >90 mL/min (ref >90)
Glucose, Bld: 146 mg/dL — ABNORMAL HIGH (ref 70–99)
Potassium: 3.8 mEq/L (ref 3.7–5.3)
SODIUM: 142 meq/L (ref 137–147)
Total Bilirubin: 0.4 mg/dL (ref 0.3–1.2)
Total Protein: 6.1 g/dL (ref 6.0–8.3)

## 2013-08-16 LAB — GLUCOSE, CAPILLARY
GLUCOSE-CAPILLARY: 110 mg/dL — AB (ref 70–99)
Glucose-Capillary: 128 mg/dL — ABNORMAL HIGH (ref 70–99)
Glucose-Capillary: 138 mg/dL — ABNORMAL HIGH (ref 70–99)
Glucose-Capillary: 156 mg/dL — ABNORMAL HIGH (ref 70–99)
Glucose-Capillary: 183 mg/dL — ABNORMAL HIGH (ref 70–99)
Glucose-Capillary: 121 mg/dL — ABNORMAL HIGH (ref 70–99)

## 2013-08-16 LAB — CBC
HCT: 37.9 % — ABNORMAL LOW (ref 39.0–52.0)
Hemoglobin: 13 g/dL (ref 13.0–17.0)
MCH: 34.6 pg — ABNORMAL HIGH (ref 26.0–34.0)
MCHC: 34.3 g/dL (ref 30.0–36.0)
MCV: 100.8 fL — ABNORMAL HIGH (ref 78.0–100.0)
PLATELETS: 128 10*3/uL — AB (ref 150–400)
RBC: 3.76 MIL/uL — ABNORMAL LOW (ref 4.22–5.81)
RDW: 13 % (ref 11.5–15.5)
WBC: 13.2 10*3/uL — ABNORMAL HIGH (ref 4.0–10.5)

## 2013-08-16 LAB — TROPONIN I

## 2013-08-16 LAB — CULTURE, GROUP A STREP

## 2013-08-16 LAB — MAGNESIUM: Magnesium: 2 mg/dL (ref 1.5–2.5)

## 2013-08-16 MED ORDER — METOPROLOL TARTRATE 25 MG/10 ML ORAL SUSPENSION
50.0000 mg | Freq: Two times a day (BID) | ORAL | Status: DC
  Administered 2013-08-16 – 2013-08-17 (×2): 50 mg via ORAL
  Filled 2013-08-16 (×4): qty 20

## 2013-08-16 MED ORDER — HYDRALAZINE HCL 20 MG/ML IJ SOLN
10.0000 mg | Freq: Four times a day (QID) | INTRAMUSCULAR | Status: DC | PRN
  Administered 2013-08-17: 20 mg via INTRAVENOUS
  Administered 2013-08-17 – 2013-08-19 (×5): 10 mg via INTRAVENOUS
  Administered 2013-08-19: 20 mg via INTRAVENOUS
  Filled 2013-08-16 (×7): qty 1

## 2013-08-16 MED ORDER — FENTANYL CITRATE 0.05 MG/ML IJ SOLN
50.0000 ug | INTRAMUSCULAR | Status: DC | PRN
  Administered 2013-08-16 (×3): 100 ug via INTRAVENOUS
  Filled 2013-08-16 (×3): qty 2

## 2013-08-16 MED ORDER — BIOTENE DRY MOUTH MT LIQD
15.0000 mL | Freq: Four times a day (QID) | OROMUCOSAL | Status: DC
  Administered 2013-08-16 – 2013-08-17 (×7): 15 mL via OROMUCOSAL

## 2013-08-16 MED ORDER — FENTANYL CITRATE 0.05 MG/ML IJ SOLN
100.0000 ug | Freq: Once | INTRAMUSCULAR | Status: AC
  Administered 2013-08-16: 100 ug via INTRAVENOUS

## 2013-08-16 MED ORDER — VITAL AF 1.2 CAL PO LIQD
1000.0000 mL | ORAL | Status: DC
  Administered 2013-08-16 – 2013-08-17 (×2): 1000 mL
  Filled 2013-08-16 (×2): qty 1000

## 2013-08-16 MED ORDER — CLONIDINE HCL 0.2 MG/24HR TD PTWK
0.2000 mg | MEDICATED_PATCH | TRANSDERMAL | Status: DC
  Administered 2013-08-16: 0.2 mg via TRANSDERMAL
  Filled 2013-08-16: qty 1

## 2013-08-16 MED ORDER — CLONIDINE HCL 0.3 MG PO TABS
0.3000 mg | ORAL_TABLET | Freq: Three times a day (TID) | ORAL | Status: DC
  Administered 2013-08-16 – 2013-08-17 (×3): 0.3 mg
  Filled 2013-08-16 (×3): qty 1

## 2013-08-16 MED ORDER — PREDNISONE 5 MG/5ML PO SOLN
40.0000 mg | Freq: Every day | ORAL | Status: DC
  Administered 2013-08-16: 40 mg
  Filled 2013-08-16 (×2): qty 40

## 2013-08-16 MED ORDER — DIPHENHYDRAMINE HCL 50 MG/ML IJ SOLN
25.0000 mg | Freq: Four times a day (QID) | INTRAMUSCULAR | Status: DC
  Administered 2013-08-16 – 2013-08-17 (×4): 25 mg via INTRAVENOUS
  Filled 2013-08-16 (×4): qty 1

## 2013-08-16 MED ORDER — CLONIDINE ORAL SUSPENSION 10 MCG/ML
0.3000 mg | Freq: Three times a day (TID) | ORAL | Status: DC
  Filled 2013-08-16 (×3): qty 30

## 2013-08-16 MED ORDER — FREE WATER
200.0000 mL | Freq: Three times a day (TID) | Status: DC
  Administered 2013-08-16 – 2013-08-17 (×3): 200 mL

## 2013-08-16 MED ORDER — SODIUM CHLORIDE 0.9 % IV SOLN
25.0000 ug/h | INTRAVENOUS | Status: DC
  Administered 2013-08-16: 50 ug/h via INTRAVENOUS
  Administered 2013-08-17: 300 ug/h via INTRAVENOUS
  Filled 2013-08-16 (×2): qty 50

## 2013-08-16 NOTE — Progress Notes (Signed)
eLink Physician-Brief Progress Note Patient Name: Richard BrownsJames Dunn DOB: 05/03/1960 MRN: [id number]  Date of Service  08/16/2013   HPI/Events of Note   HTN on sedation. Last fentanyl given 30 min ago. HR in 40's so will avoid labetalol  eICU Interventions  - clonidine patch in setting withdrawal - hydralazine prn - increase freq fentanyl     Intervention Category Intermediate Interventions: Hypertension - evaluation and management  BYRUM,ROBERT S. 08/16/2013, 3:38 AM

## 2013-08-16 NOTE — Progress Notes (Signed)
INITIAL NUTRITION ASSESSMENT  DOCUMENTATION CODES Per approved criteria  -Not Applicable   INTERVENTION:  If unable to extubate, recommend, TF with Vital AF 1.2 at 20 ml/hr, increase 10 ml every 4 hours to goal of 60 ml/hr (1728 kcal, 108 gm protein, 1168 ml free water).  To provide 98% estimated kcal needs, 100% estimated protein needs.  RD to follow.  NUTRITION DIAGNOSIS: Inadequate oral intake related to inability to eat as evidenced by npo status.   Goal: Meet >90% estimated needs.  Monitor:  Plan of care, labs, weight trend, diet initiation.  Reason for Assessment: vent  53 y.o. male  Admitting Dx: <principal problem not specified>  ASSESSMENT: [facility] secondary to throat swelling most likely secondary to reaction to ACE inhibitor.  Now receiving mechanical ventilation.  Patient's diet hx unknown.  [facility]  Patient is currently intubated on ventilator support MV: 10 L/min Temp (24hrs), Avg:97.6 F (36.4 C), Min:96.1 F (35.6 C), Max:99.1 F (37.3 C)    Height: Ht Readings from Last 1 Encounters:  08/15/13 5\' 9"  (1.753 m)    Weight: Wt Readings from Last 1 Encounters:  08/16/13 146 lb 9.7 oz (66.5 kg)    Ideal Body Weight:  160 lbs  % Ideal Body Weight: 91  Wt Readings from Last 10 Encounters:  08/16/13 146 lb 9.7 oz (66.5 kg)  09/12/08 188 lb (85.276 kg)    Usual Body Weight: 188 lbs 5 years ago  % Usual Body Weight: 78% of weight 5 years ago  BMI:  Body mass index is 21.64 kg/(m^2).  Estimated Nutritional Needs: Kcal: 1768 Protein: 95-105 gm Fluid:  >/=1.7L  Skin: intact  Diet Order: NPO  EDUCATION NEEDS: -No education needs identified at this time   Intake/Output Summary (Last 24 hours) at 08/16/13 0748 Last data filed at 08/16/13 0700  Gross per 24 hour  Intake 3457.05 ml  Output   2375 ml  Net 1082.05 ml    Last BM: unknown    Labs:   Recent Labs Lab 08/14/13 2230 08/15/13 0312 08/15/13 1600 08/15/13 2130 08/16/13 0515  NA 137  --   --  141 142  K 4.2  --   --  3.8 3.8  CL 95*  --   --  106 108  CO2 26  --   --  25 23  BUN 10  --   --  7 6  CREATININE 0.62  --   --  0.55 0.54  CALCIUM 9.0  --   --  8.5 8.4  MG  --  1.4* 2.2  --  2.0  PHOS  --  1.7*  --   --   --   GLUCOSE 157*  --   --  119* 146*    CBG (last 3)   Recent Labs  08/15/13 1358 08/15/13 1647 08/15/13 2005  GLUCAP 176* 166* 150*    Scheduled Meds: . antiseptic oral rinse  15 mL Mouth Rinse QID  . chlorhexidine  15 mL Mouth Rinse BID  . cloNIDine  0.2 mg Transdermal Weekly  . diphenhydrAMINE  50 mg Intravenous Q6H  . famotidine (PEPCID) IV  20 mg Intravenous Q12H  . folic acid  1 mg Intravenous Daily  . heparin subcutaneous  5,000 Units Subcutaneous 3 times per day  . insulin aspart  0-20 Units Subcutaneous 6 times per day  . methylPREDNISolone (SOLU-MEDROL) injection  40 mg Intravenous Q6H  .  metoprolol tartrate  50 mg Oral BID  . thiamine IV  100 mg Intravenous Daily    Continuous Infusions: . dextrose 5 % and 0.45 % NaCl with KCl 20 mEq/L 100 mL/hr at 08/16/13 0323  . propofol 20 mcg/kg/min (08/16/13 0700)    Past Medical History  Diagnosis Date  . Hypertension   . Degenerative disc disease   . Depression     Past Surgical History  Procedure Laterality Date  . Cervical spine surgery      Oran ReinLaura Jobe, RD, LDN Clinical Inpatient Dietitian Pager:  [phone] Weekend and after hours pager:  [phone]

## 2013-08-16 NOTE — Progress Notes (Signed)
PULMONARY / CRITICAL CARE MEDICINE   Name: Richard BrownsJames Woolever MRN: [id number] DOB: 03/21/1960    ADMISSION DATE:  08/14/2013  REFERRING MD : Benjiman CoreNathan Pickering  CHIEF COMPLAINT:  Difficulty swallowing  BRIEF PATIENT DESCRIPTION:  53 yo male with hx of ETOH presented to behavioral health for alcohol detox.  He was given lisinopril for hypertension.  Developed throat swelling and difficulty breathing.  PCCM asked to admit to ICU.  SIGNIFICANT EVENTS: 6/17 Admit, ENT evaluation in ER 6/18 - intubated due to agitation  STUDIES:  6/17 Bedside laryngoscopy >> palatal/supraglottic edema, vocal cords mobile  LINES / TUBES: ETT 6/18 CVL 6/18 >>   SUBJECTIVE:  Anxious  VITAL SIGNS: Temp:  [96.1 F (35.6 C)-99.1 F (37.3 C)] 98.1 F (36.7 C) (06/19 0830) Pulse Rate:  [47-120] 57 (06/19 0830) Resp:  [16-26] 18 (06/19 0830) BP: (77-209)/(55-119) 194/112 mmHg (06/19 0830) SpO2:  [94 %-100 %] 100 % (06/19 0830) FiO2 (%):  [40 %-100 %] 40 % (06/19 0823) Weight:  [66.5 kg (146 lb 9.7 oz)] 66.5 kg (146 lb 9.7 oz) (06/19 0400) Room air  INTAKE / OUTPUT: Intake/Output     06/18 0701 - 06/19 0700 06/19 0701 - 06/20 0700   I.V. (mL/kg) 2271.1 (34.2) 107.6 (1.6)   Blood     IV Piggyback 1186    Total Intake(mL/kg) 3457.1 (52) 107.6 (1.6)   Urine (mL/kg/hr) 2075 (1.3)    Emesis/NG output 300 (0.2)    Total Output 2375     Net +1082.1 +107.6          PHYSICAL EXAMINATION: General: critically ill looking Neuro: intermittently agitated. Sedated on diprivan and now fentanyl  HEENT:  Pupils reactive, garbled speech, no tongue swelling, no LAN, no stridor Cardiovascular:  Regular, tachycardic Lungs:  No wheeze/rales Abdomen:  Soft, non tender, + bowel sounds Musculoskeletal:  No edema Skin:  Sweaty, no rashes  LABS:  CBC  Recent Labs Lab 08/14/13 2230 08/15/13 0312 08/16/13 0515  WBC 7.2 7.0 13.2*  HGB 15.7 15.2 13.0  HCT 44.8 44.1 37.9*  PLT 139* 122* 128*   BMET  Recent  Labs Lab 08/14/13 2230 08/15/13 2130 08/16/13 0515  NA 137 141 142  K 4.2 3.8 3.8  CL 95* 106 108  CO2 26 25 23   BUN 10 7 6   CREATININE 0.62 0.55 0.54  GLUCOSE 157* 119* 146*   Electrolytes  Recent Labs Lab 08/14/13 2230 08/15/13 0312 08/15/13 1600 08/15/13 2130 08/16/13 0515  CALCIUM 9.0  --   --  8.5 8.4  MG  --  1.4* 2.2  --  2.0  PHOS  --  1.7*  --   --   --    Liver Enzymes  Recent Labs Lab 08/14/13 1432 08/14/13 2230 08/16/13 0515  AST 104* 92* 49*  ALT 70* 64* 43  ALKPHOS 125* 122* 84  BILITOT 1.0 1.2 0.4  ALBUMIN 3.3* 3.3* 2.8*   Glucose  Recent Labs Lab 08/15/13 0422 08/15/13 0727 08/15/13 1321 08/15/13 1358 08/15/13 1647 08/15/13 2005  GLUCAP 114* 179* 173* 176* 166* 150*    Imaging Dg Neck Soft Tissue  08/14/2013   CLINICAL DATA:  Short of breath.  Angioedema.  EXAM: NECK SOFT TISSUES - 1+ VIEW  COMPARISON:  12/02/2012  FINDINGS: Prevertebral soft tissue swelling is present. Prevertebral soft tissues anterior to C2 measure up to 23 mm. This represents a substantial interval change compared to prior CT 12/02/2012. No destructive osseous lesions. Long segment cervical fusion that appears to extend  from C3 through C7. Epiglottis grossly appears within normal limits allowing for posterior mass effect from prevertebral soft tissue swelling.  IMPRESSION: Marked prevertebral soft tissue swelling. This may be related to edema, prevertebral effusion / retropharyngeal infection.   Electronically Signed   By: Andreas Newport M.D.   On: 08/14/2013 22:17   Portable Chest Xray In Am  08/16/2013   CLINICAL DATA:  Hypoxia  EXAM: PORTABLE CHEST - 1 VIEW  COMPARISON:  August 15, 2013  FINDINGS: Endotracheal tube tip is 3.4 cm above the carina. Central catheter tip is in the superior vena cava near the cavoatrial junction. Nasogastric tube tip and side port are in the stomach. No pneumothorax. There is no edema or consolidation. The heart size and pulmonary vascularity  are within normal limits. No adenopathy. There is postoperative change in the cervical spine. There is arthropathy in each shoulder.  IMPRESSION: The tube and catheter positions as described without pneumothorax. Lungs clear.   Electronically Signed   By: Bretta Bang M.D.   On: 08/16/2013 07:34   Portable Chest Xray  08/15/2013   CLINICAL DATA:  ET tube placement.  EXAM: PORTABLE CHEST - 1 VIEW  COMPARISON:  Single view of the chest 08/15/2013 at 12:05 a.m.  FINDINGS: Endotracheal tube is in place with the tip in good position 4.8 cm above the carina. A new right IJ catheter is also in place with the tip projecting in the mid to lower superior vena cava. Lungs are clear. Heart size is normal. No pneumothorax or pleural effusion. Marked degenerative change right glenohumeral joint noted.  IMPRESSION: ET tube and right IJ catheter project in good position. Negative for pneumothorax. Lungs clear.   Electronically Signed   By: Drusilla Kanner M.D.   On: 08/15/2013 13:28   Dg Chest Port 1 View  08/15/2013   CLINICAL DATA:  Cough and congestion.  History of smoking.  EXAM: PORTABLE CHEST - 1 VIEW  COMPARISON:  Chest radiograph performed 08/14/2013  FINDINGS: The lungs are well-aerated and clear. There is no evidence of focal opacification, pleural effusion or pneumothorax.  The cardiomediastinal silhouette is within normal limits. No acute osseous abnormalities are seen. Degenerative change is noted at the right humeral head, with prominent osteophyte formation. Cervical spinal fusion hardware is partially imaged.  IMPRESSION: No acute cardiopulmonary process seen.   Electronically Signed   By: Roanna Raider M.D.   On: 08/15/2013 00:44   Dg Chest Port 1 View  08/14/2013   CLINICAL DATA:  Short of breath.  Throat swelling.  EXAM: PORTABLE CHEST - 1 VIEW  COMPARISON:  06/27/2005.  FINDINGS: Cardiopericardial silhouette within normal limits. Mediastinal contours normal. Trachea midline. No airspace disease  or effusion. Monitoring leads project over the chest. 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  IMPRESSION: No active disease.   Electronically Signed   By: Andreas Newport M.D.   On: 08/14/2013 22:14   Dg Abd Portable 1v  08/15/2013   CLINICAL DATA:  53 year old male with enteric tube placement. Initial encounter.  EXAM: PORTABLE ABDOMEN - 1 VIEW  COMPARISON:  Lumbar radiographs 12/08/2007.  FINDINGS: Portable AP supine view at 1446 hrs. Multiple EKG leads and wires overlie the chest. Enteric tube in place, looped in the left upper quadrant. Tip and side hole at the level of the gastric body.  Non obstructed bowel gas pattern. Negative visualized lung bases. No acute osseous abnormality identified.  IMPRESSION: Enteric tube placed, side hole at the level of  the gastric body.   Electronically Signed   By: Augusto GambleLee  Hall M.D.   On: 08/15/2013 14:57    ASSESSMENT / PLAN:  PULMONARY A: Acute respiratory failure due to altered mental status and complicated  angioedema most likely from ACE inhibitor > intubated on 6/18 more for ETOH w/d and escalated agitation. Cords were a little edematous but airway was not difficult  >CXR clear  P:   Full vent support PAD protocol  Taper steroids as seems to be worsening delirium Cont H2 blockade  PRN albuterol  CARDIOVASCULAR A:  Sinus tachycardia 2nd to ETOH withdrawal  Hx of HTN. Elevated T waves. Not sure if this is artifact.  P:  Listed ACE inhibitor as allergy PRN labetalol Clonidine  12 lead Ck CEs Consider echo if enzymes elevated   RENAL A:   Hypomagnesemia -->resolved Hypophosphatemia -->resolved  P:   F/u and replace electrolytes as needed Close obs of Mg in setting of ETOH  GASTROINTESTINAL A:   Elevated LFT's 2nd to ETOH. Nutrition. P:   Start tube feeds   HEMATOLOGIC A:   Thrombocytopenia likely 2nd to ETOH. P:  F/u CBC SQ heparin for DVT prevention  INFECTIOUS A:   No evidence for  infection. P:   Monitor for evidence of aspiration  ENDOCRINE A:   Steroid induced hyperglycemia.   P:   SSI while on steroids  NEUROLOGIC A:   Alcohol withdrawal. Hx of depression. P:   Ativan prn  Cont propofol, clonidine and add fent gtt  Cont thiamine and folate RASS goal -3, hope to decrease this to -2 or -1 on 6/20 .   GLOBAL No family at bedside  STAFF note  STAFF NOTE: I, Dr Lavinia SharpsM Rydge Texidor have personally reviewed patient's available data, including medical history, events of note, physical examination and test results as part of my evaluation. I have discussed with resident/NP and other care providers such as pharmacist, RN and RRT.  In addition,  I personally evaluated patient and elicited key findings of severe DTs, acute encephaloapthy. Needing diprivan, fent and clonidine to gain control. Continue vent support till delirium resolves.  Rest per NP/medical resident whose note is outlined above and that I agree with  The patient is critically ill with multiple organ systems failure and requires high complexity decision making for assessment and support, frequent evaluation and titration of therapies, application of advanced monitoring technologies and extensive interpretation of multiple databases.   Critical Care Time devoted to patient care services described in this note is  35  Minutes.  Dr. Kalman ShanMurali Noami Bove, M.D., [facility].C.C.P Pulmonary and Critical Care Medicine Staff Physician [place] [facility]  Pulmonary and Critical Care Pager: [phone], If no answer or between  15:00h - 7:00h: call [phone]  08/16/2013 11:39 AM

## 2013-08-17 DIAGNOSIS — Z5189 Encounter for other specified aftercare: Secondary | ICD-10-CM

## 2013-08-17 LAB — CBC
HCT: 38 % — ABNORMAL LOW (ref 39.0–52.0)
HEMOGLOBIN: 12.2 g/dL — AB (ref 13.0–17.0)
MCH: 33.7 pg (ref 26.0–34.0)
MCHC: 32.1 g/dL (ref 30.0–36.0)
MCV: 105 fL — AB (ref 78.0–100.0)
PLATELETS: 135 10*3/uL — AB (ref 150–400)
RBC: 3.62 MIL/uL — AB (ref 4.22–5.81)
RDW: 13.6 % (ref 11.5–15.5)
WBC: 10.6 10*3/uL — AB (ref 4.0–10.5)

## 2013-08-17 LAB — CK TOTAL AND CKMB (NOT AT ARMC)
CK TOTAL: 57 U/L (ref 7–232)
CK, MB: <1 ng/mL (ref 0.3–4.0)

## 2013-08-17 LAB — LACTIC ACID, PLASMA: Lactic Acid, Venous: 1.4 mmol/L (ref 0.5–2.2)

## 2013-08-17 LAB — GLUCOSE, CAPILLARY
GLUCOSE-CAPILLARY: 96 mg/dL (ref 70–99)
Glucose-Capillary: 109 mg/dL — ABNORMAL HIGH (ref 70–99)
Glucose-Capillary: 108 mg/dL — ABNORMAL HIGH (ref 70–99)

## 2013-08-17 LAB — COMPREHENSIVE METABOLIC PANEL
ALT: 39 U/L (ref 0–53)
AST: 39 U/L — AB (ref 0–37)
Albumin: 2.6 g/dL — ABNORMAL LOW (ref 3.5–5.2)
Alkaline Phosphatase: 78 U/L (ref 39–117)
BILIRUBIN TOTAL: 0.2 mg/dL — AB (ref 0.3–1.2)
BUN: 7 mg/dL (ref 6–23)
CALCIUM: 8.2 mg/dL — AB (ref 8.4–10.5)
CHLORIDE: 111 meq/L (ref 96–112)
CO2: 23 mEq/L (ref 19–32)
CREATININE: 0.58 mg/dL (ref 0.50–1.35)
GFR calc Af Amer: >90 mL/min (ref >90)
GFR calc non Af Amer: >90 mL/min (ref >90)
GLUCOSE: 109 mg/dL — AB (ref 70–99)
Potassium: 3.7 mEq/L (ref 3.7–5.3)
Sodium: 145 mEq/L (ref 137–147)
Total Protein: 5.5 g/dL — ABNORMAL LOW (ref 6.0–8.3)

## 2013-08-17 LAB — MAGNESIUM: Magnesium: 1.8 mg/dL (ref 1.5–2.5)

## 2013-08-17 LAB — PHOSPHORUS: PHOSPHORUS: 3.2 mg/dL (ref 2.3–4.6)

## 2013-08-17 MED ORDER — LORAZEPAM 2 MG/ML IJ SOLN
1.0000 mg | INTRAMUSCULAR | Status: DC | PRN
  Administered 2013-08-17 (×4): 2 mg via INTRAVENOUS
  Administered 2013-08-18: 1 mg via INTRAVENOUS
  Administered 2013-08-18: 2 mg via INTRAVENOUS
  Filled 2013-08-17 (×6): qty 1

## 2013-08-17 MED ORDER — CHLORHEXIDINE GLUCONATE 0.12 % MT SOLN
15.0000 mL | Freq: Two times a day (BID) | OROMUCOSAL | Status: DC
  Administered 2013-08-18 (×2): 15 mL via OROMUCOSAL
  Filled 2013-08-17 (×2): qty 15

## 2013-08-17 MED ORDER — BIOTENE DRY MOUTH MT LIQD
15.0000 mL | Freq: Two times a day (BID) | OROMUCOSAL | Status: DC
  Administered 2013-08-18 (×2): 15 mL via OROMUCOSAL

## 2013-08-17 MED ORDER — DEXMEDETOMIDINE HCL IN NACL 200 MCG/50ML IV SOLN
0.2000 ug/kg/h | INTRAVENOUS | Status: AC
  Administered 2013-08-17: 0.7 ug/kg/h via INTRAVENOUS
  Administered 2013-08-17: 0.6 ug/kg/h via INTRAVENOUS
  Administered 2013-08-17: 0.5 ug/kg/h via INTRAVENOUS
  Administered 2013-08-17: 0.7 ug/kg/h via INTRAVENOUS
  Administered 2013-08-18 (×2): 0.5 ug/kg/h via INTRAVENOUS
  Filled 2013-08-17 (×6): qty 50

## 2013-08-17 MED ORDER — FENTANYL CITRATE 0.05 MG/ML IJ SOLN
25.0000 ug | INTRAMUSCULAR | Status: DC | PRN
  Administered 2013-08-19: 25 ug via INTRAVENOUS
  Filled 2013-08-17: qty 2

## 2013-08-17 NOTE — Progress Notes (Signed)
Extubated pt to 4L nasal cannula per md order. Vitals are hr 64 sat 99 rr14. Incentive spirometer instructed pt has poor effort at this time.

## 2013-08-17 NOTE — Progress Notes (Signed)
PULMONARY / CRITICAL CARE MEDICINE   Name: Richard Dunn MRN: [id number] DOB: 03/20/1960    ADMISSION DATE:  08/14/2013  REFERRING MD : Benjiman CoreNathan Pickering  CHIEF COMPLAINT:  Difficulty swallowing  BRIEF PATIENT DESCRIPTION:  53 yo male with hx of ETOH presented to behavioral health for alcohol detox.  He was given lisinopril for hypertension.  Developed throat swelling and difficulty breathing.  PCCM asked to admit to ICU.  SIGNIFICANT EVENTS: 6/17 Admit, ENT evaluation in ER 6/18 intubated due to agitation  STUDIES:  6/17 Bedside laryngoscopy >> palatal/supraglottic edema, vocal cords mobile  LINES / TUBES: ETT 6/18 >> 6/20 CVL 6/18 >>  SUBJECTIVE:  Tolerating SBT  VITAL SIGNS: Temp:  [97.5 F (36.4 C)-99 F (37.2 C)] 98.6 F (37 C) (06/20 0800) Pulse Rate:  [42-82] 49 (06/20 0800) Resp:  [15-22] 16 (06/20 0800) BP: (92-171)/(60-104) 134/81 mmHg (06/20 0800) SpO2:  [94 %-100 %] 98 % (06/20 0800) FiO2 (%):  [40 %] 40 % (06/20 0852) Weight:  [143 lb 8.3 oz (65.1 kg)] 143 lb 8.3 oz (65.1 kg) (06/20 0400) INTAKE / OUTPUT: Intake/Output     06/19 0701 - 06/20 0700 06/20 0701 - 06/21 0700   I.V. (mL/kg) 2484.7 (38.2) 105.1 (1.6)   NG/GT 808.5 60   IV Piggyback 100    Total Intake(mL/kg) 3393.2 (52.1) 165.1 (2.5)   Urine (mL/kg/hr) 1180 (0.8) 60 (0.4)   Emesis/NG output  200 (1.3)   Total Output 1180 260   Net +2213.2 -94.9          PHYSICAL EXAMINATION: General: no distress Neuro: RASS -1, follows commands HEENT: ETT in place Cardiovascular:  Regular, no murmur Lungs:  No wheeze/rales Abdomen:  Soft, non tender, + bowel sounds Musculoskeletal:  No edema Skin: no rashes  LABS:  CBC  Recent Labs Lab 08/15/13 0312 08/16/13 0515 08/17/13 0500  WBC 7.0 13.2* 10.6*  HGB 15.2 13.0 12.2*  HCT 44.1 37.9* 38.0*  PLT 122* 128* 135*   BMET  Recent Labs Lab 08/15/13 2130 08/16/13 0515 08/17/13 0500  NA 141 142 145  K 3.8 3.8 3.7  CL 106 108 111  CO2 25  23 23   BUN 7 6 7   CREATININE 0.55 0.54 0.58  GLUCOSE 119* 146* 109*   Electrolytes  Recent Labs Lab 08/15/13 0312 08/15/13 1600 08/15/13 2130 08/16/13 0515 08/17/13 0500  CALCIUM  --   --  8.5 8.4 8.2*  MG 1.4* 2.2  --  2.0 1.8  PHOS 1.7*  --   --   --  3.2   Liver Enzymes  Recent Labs Lab 08/14/13 2230 08/16/13 0515 08/17/13 0500  AST 92* 49* 39*  ALT 64* 43 39  ALKPHOS 122* 84 78  BILITOT 1.2 0.4 0.2*  ALBUMIN 3.3* 2.8* 2.6*   Glucose  Recent Labs Lab 08/16/13 1220 08/16/13 1540 08/16/13 1958 08/16/13 2321 08/17/13 0325 08/17/13 0749  GLUCAP 110* 183* 121* 109* 108* 96    Imaging Portable Chest Xray In Am  08/16/2013   CLINICAL DATA:  Hypoxia  EXAM: PORTABLE CHEST - 1 VIEW  COMPARISON:  August 15, 2013  FINDINGS: Endotracheal tube tip is 3.4 cm above the carina. Central catheter tip is in the superior vena cava near the cavoatrial junction. Nasogastric tube tip and side port are in the stomach. No pneumothorax. There is no edema or consolidation. The heart size and pulmonary vascularity are within normal limits. No adenopathy. There is postoperative change in the cervical spine. There is arthropathy in each  shoulder.  IMPRESSION: The tube and catheter positions as described without pneumothorax. Lungs clear.   Electronically Signed   By: Bretta BangWilliam  Woodruff M.D.   On: 08/16/2013 07:34   Portable Chest Xray  08/15/2013   CLINICAL DATA:  ET tube placement.  EXAM: PORTABLE CHEST - 1 VIEW  COMPARISON:  Single view of the chest 08/15/2013 at 12:05 a.m.  FINDINGS: Endotracheal tube is in place with the tip in good position 4.8 cm above the carina. A new right IJ catheter is also in place with the tip projecting in the mid to lower superior vena cava. Lungs are clear. Heart size is normal. No pneumothorax or pleural effusion. Marked degenerative change right glenohumeral joint noted.  IMPRESSION: ET tube and right IJ catheter project in good position. Negative for  pneumothorax. Lungs clear.   Electronically Signed   By: Drusilla Kannerhomas  Dalessio M.D.   On: 08/15/2013 13:28   Dg Abd Portable 1v  08/15/2013   CLINICAL DATA:  53 year old male with enteric tube placement. Initial encounter.  EXAM: PORTABLE ABDOMEN - 1 VIEW  COMPARISON:  Lumbar radiographs 12/08/2007.  FINDINGS: Portable AP supine view at 1446 hrs. Multiple EKG leads and wires overlie the chest. Enteric tube in place, looped in the left upper quadrant. Tip and side hole at the level of the gastric body.  Non obstructed bowel gas pattern. Negative visualized lung bases. No acute osseous abnormality identified.  IMPRESSION: Enteric tube placed, side hole at the level of the gastric body.   Electronically Signed   By: Augusto GambleLee  Hall M.D.   On: 08/15/2013 14:57    ASSESSMENT / PLAN:  PULMONARY A: Acute respiratory failure due to altered mental status and complicated  angioedema most likely from ACE inhibitor. P:   Proceed with extubation 6/20 D/c prednisone/ benadryl 6/20 F/u CXR as needed  CARDIOVASCULAR A:  Sinus tachycardia 2nd to ETOH withdrawal >> improved. Hx of HTN. Prolonged Qt. P:  F/u ECG >> may need cardiology evaluation PRN hydralazine, labetalol IV until able to swallow  RENAL A:   Hypomagnesemia, hypophosphatemia. P:   F/u and replace electrolytes as needed  GASTROINTESTINAL A:   Elevated LFT's 2nd to ETOH. Nutrition. Dysphagia. P:   NPO >> speech therapy to assess before advancing diet after extubation Pepcid  HEMATOLOGIC A:   Thrombocytopenia likely 2nd to ETOH. P:  F/u CBC SQ heparin for DVT prevention  INFECTIOUS A:   No evidence for infection. P:   Monitor for evidence of aspiration  ENDOCRINE A:   Steroid induced hyperglycemia.   P:   D/c SSI 6/20 since prednisone d/c'ed  NEUROLOGIC A:   Alcohol withdrawal. Hx of depression. P:   Retry precedex after extubation 6/20 CIWA q4h PRN ativan for CIWA > 8 Continue thiamine and folate  CC time 40  minutes.  Coralyn HellingVineet Sood, MD [facility] Pulmonary/Critical Care 08/17/2013, 9:23 AM Pager:  [phone] After 3pm call: [phone]

## 2013-08-17 NOTE — Evaluation (Addendum)
Clinical/Bedside Swallow Evaluation Patient Details  Name: Richard BrownsJames Dunn MRN: [id number] Date of Birth: 08/29/1960  Today's Date: 08/17/2013 Time: [id number] SLP Time Calculation (min): 26 min  Past Medical History:  Past Medical History  Diagnosis Date  . Hypertension   . Degenerative disc disease   . Depression    Past Surgical History:  Past Surgical History  Procedure Laterality Date  . Cervical spine surgery     HPI:  53 [facility]edema requiring intubation 6/18 s/p extubation 6/20.  BSE ordered.  Pt with palatal and supraglottic edema but vocal cords were mobile per md note.  Pt has h/o ETOH use and was in rehab when events occurred.  PSH + for ACDF surgery C3-C7 in 2009 - and he reports acute dysphagia immediately after surgery that resolved.      Assessment / Plan / Recommendation Clinical Impression  Pt presents with concern for airway protection characterized by weak phonation that is likely exacerbated by pt's mental status.  Pt required max cueing to maintain LOA and accept po.  Delayed oral transiting and multiple swallows across consistencies with delayed weak cough after intake of tsp water concerning for aspiration and stasis.     SLP suspects pt's dysphagia to resolve with improved mental status and resolution of edema.  SLP educated pt to findings and recommendations including goals- he was agreeable to plan.    Recommend NPO and SLP follow up at bedside for clinical improvement.      Aspiration Risk  Severe    Diet Recommendation NPO   Medication Administration: Via alternative means    Other  Recommendations   TBD  Follow Up Recommendations    TBD   Frequency and Duration min 1 x/week  2 weeks   Pertinent Vitals/Pain Afebrile, decreased     Swallow Study Prior Functional Status       General Date of Onset: 08/17/13 HPI: 53 [facility] with [facility]ioedema requiring intubation s/p extubation 6/20.  BSE ordered.  Pt with  palatal and supraglottic edema but vocal cords were mobile.  PSH + for ACDF surgery C3-C7 in 2009 - and he reports acute dysphagia immediately after surgery that resolved.    Type of Study: Bedside swallow evaluation Diet Prior to this Study: NPO Temperature Spikes Noted: No Respiratory Status: Room air History of Recent Intubation: Yes Length of Intubations (days): 3 days Date extubated: 08/17/13 Behavior/Cognition: Cooperative;Pleasant mood;Lethargic;Requires cueing Oral Cavity - Dentition: Adequate natural dentition (upper dentures not in hospital per pt ) Self-Feeding Abilities: Needs assist Patient Positioning: Upright in bed Baseline Vocal Quality: Low vocal intensity;Hoarse;Breathy Volitional Cough: Weak Volitional Swallow: Able to elicit    Oral/Motor/Sensory Function Overall Oral Motor/Sensory Function:  (gross weakness, no focal cn deficits)   Ice Chips Ice chips: Impaired Presentation: Spoon Oral Phase Impairments: Impaired anterior to posterior transit;Reduced lingual movement/coordination;Poor awareness of bolus Oral Phase Functional Implications: Prolonged oral transit;Oral holding Pharyngeal Phase Impairments: Suspected delayed Swallow;Cough - Delayed;Decreased hyoid-laryngeal movement   Thin Liquid Thin Liquid: Impaired Presentation: Spoon Oral Phase Impairments: Impaired anterior to posterior transit;Reduced lingual movement/coordination Oral Phase Functional Implications: Prolonged oral transit Pharyngeal  Phase Impairments: Suspected delayed Swallow;Multiple swallows;Cough - Delayed    Nectar Thick Nectar Thick Liquid: Not tested   Honey Thick Honey Thick Liquid: Not tested   Puree Puree: Not tested   Solid   GO    Solid: Not tested       Richard Burnetamara Janat Tabbert, MS [facility] SLP [phone]

## 2013-08-18 LAB — CBC
HEMATOCRIT: 41.6 % (ref 39.0–52.0)
HEMOGLOBIN: 14 g/dL (ref 13.0–17.0)
MCH: 34.1 pg — ABNORMAL HIGH (ref 26.0–34.0)
MCHC: 33.7 g/dL (ref 30.0–36.0)
MCV: 101.2 fL — ABNORMAL HIGH (ref 78.0–100.0)
Platelets: 168 10*3/uL (ref 150–400)
RBC: 4.11 MIL/uL — AB (ref 4.22–5.81)
RDW: 13.1 % (ref 11.5–15.5)
WBC: 6.3 10*3/uL (ref 4.0–10.5)

## 2013-08-18 LAB — BASIC METABOLIC PANEL
BUN: 4 mg/dL — ABNORMAL LOW (ref 6–23)
CHLORIDE: 101 meq/L (ref 96–112)
CO2: 26 mEq/L (ref 19–32)
Calcium: 8.6 mg/dL (ref 8.4–10.5)
Creatinine, Ser: 0.56 mg/dL (ref 0.50–1.35)
Glucose, Bld: 91 mg/dL (ref 70–99)
POTASSIUM: 3.8 meq/L (ref 3.7–5.3)
Sodium: 139 mEq/L (ref 137–147)

## 2013-08-18 LAB — MAGNESIUM: MAGNESIUM: 1.8 mg/dL (ref 1.5–2.5)

## 2013-08-18 LAB — PHOSPHORUS: PHOSPHORUS: 3.8 mg/dL (ref 2.3–4.6)

## 2013-08-18 NOTE — Progress Notes (Signed)
PULMONARY / CRITICAL CARE MEDICINE   Name: Richard Dunn MRN: [id number] DOB: 03/15/1960    ADMISSION DATE:  08/14/2013  REFERRING MD : Benjiman CoreNathan Pickering  CHIEF COMPLAINT:  Difficulty swallowing  BRIEF PATIENT DESCRIPTION:  53 yo male with hx of ETOH presented to behavioral health for alcohol detox.  He was given lisinopril for hypertension.  Developed throat swelling and difficulty breathing.  PCCM asked to admit to ICU.  SIGNIFICANT EVENTS: 6/17 Admit, ENT evaluation in ER 6/18 intubated due to agitation 6/20 extubated, restarted precedex, failed swallow evaluation  STUDIES:  6/17 Bedside laryngoscopy >> palatal/supraglottic edema, vocal cords mobile  LINES / TUBES: ETT 6/18 >> 6/20 CVL 6/18 >>  SUBJECTIVE:  Calm on precedex gtt  VITAL SIGNS: Temp:  [97.5 F (36.4 C)-99.7 F (37.6 C)] 98.2 F (36.8 C) (06/21 0600) Pulse Rate:  [50-74] 54 (06/21 0600) Resp:  [10-21] 11 (06/21 0600) BP: (101-196)/(60-128) 177/100 mmHg (06/21 0400) SpO2:  [97 %-100 %] 98 % (06/21 0600) FiO2 (%):  [40 %] 40 % (06/20 0900) INTAKE / OUTPUT: Intake/Output     06/20 0701 - 06/21 0700 06/21 0701 - 06/22 0700   I.V. (mL/kg) 1970.8 (30.3)    NG/GT 60    IV Piggyback 100    Total Intake(mL/kg) 2130.8 (32.7)    Urine (mL/kg/hr) 4610 (3)    Emesis/NG output 200 (0.1)    Total Output 4810     Net -2679.2            PHYSICAL EXAMINATION: General: no distress Neuro: RASS 0, follows commands, oriented to person/place HEENT: no sinus tenderness Cardiovascular:  Regular, no murmur Lungs:  No wheeze/rales Abdomen:  Soft, non tender, + bowel sounds Musculoskeletal:  No edema Skin: no rashes  LABS:  CBC  Recent Labs Lab 08/16/13 0515 08/17/13 0500 08/18/13 0515  WBC 13.2* 10.6* 6.3  HGB 13.0 12.2* 14.0  HCT 37.9* 38.0* 41.6  PLT 128* 135* 168   BMET  Recent Labs Lab 08/16/13 0515 08/17/13 0500 08/18/13 0515  NA 142 145 139  K 3.8 3.7 3.8  CL 108 111 101  CO2 23 23 26    BUN 6 7 4*  CREATININE 0.54 0.58 0.56  GLUCOSE 146* 109* 91   Electrolytes  Recent Labs Lab 08/15/13 0312  08/16/13 0515 08/17/13 0500 08/18/13 0515  CALCIUM  --   < > 8.4 8.2* 8.6  MG 1.4*  < > 2.0 1.8 1.8  PHOS 1.7*  --   --  3.2 3.8  < > = values in this interval not displayed.  Liver Enzymes  Recent Labs Lab 08/14/13 2230 08/16/13 0515 08/17/13 0500  AST 92* 49* 39*  ALT 64* 43 39  ALKPHOS 122* 84 78  BILITOT 1.2 0.4 0.2*  ALBUMIN 3.3* 2.8* 2.6*   Glucose  Recent Labs Lab 08/16/13 1220 08/16/13 1540 08/16/13 1958 08/16/13 2321 08/17/13 0325 08/17/13 0749  GLUCAP 110* 183* 121* 109* 108* 96    Imaging No results found.  ASSESSMENT / PLAN:  PULMONARY A: Acute respiratory failure due to altered mental status and complicated  angioedema most likely from ACE inhibitor. P:   Monitor oxygenation Bronchial hygiene  CARDIOVASCULAR A:  Sinus tachycardia 2nd to ETOH withdrawal >> improved. Hx of HTN. Prolonged Qt. P:  F/u ECG >> may need cardiology evaluation PRN hydralazine, labetalol IV until able to swallow  RENAL A:   Hypomagnesemia, hypophosphatemia. P:   F/u and replace electrolytes as needed  GASTROINTESTINAL A:   Elevated LFT's 2nd  to ETOH. Nutrition. Dysphagia >> failed swallow evaluation 6/20. P:   NPO >> speech therapy to f/u and re-assess as mental status improves Pepcid  HEMATOLOGIC A:   Thrombocytopenia likely 2nd to ETOH. P:  F/u CBC SQ heparin for DVT prevention  INFECTIOUS A:   No evidence for infection. P:   Monitor for evidence of aspiration  ENDOCRINE A:   Steroid induced hyperglycemia.   P:   D/c SSI 6/20 since prednisone d/c'ed  NEUROLOGIC A:   Alcohol withdrawal. Hx of depression. P:   Resumed precedex 6/20 Goal RASS 0 to -1 CIWA q4h PRN ativan for CIWA > 8 Continue thiamine and folate  CC time 35 minutes.  Coralyn HellingVineet Sood, MD [facility] 08/18/2013, 8:31 AM Pager:   [phone] After 3pm call: [phone]

## 2013-08-18 NOTE — Progress Notes (Signed)
SLP Cancellation Note  Patient Details Name: Richard BrownsJames Withem MRN: [id number] DOB: 04/03/1960   Cancelled treatment: ST confirmed to defer swallow evaluation to 08/19/13 secondary to patient presenting with lethargy with inability to participate fully.   Moreen FowlerKaren Cassidie Veiga MS, CCC-SLP [phone] [facility] CenterDANKOF,Sharalyn Lomba 08/18/2013, 4:31 PM

## 2013-08-19 DIAGNOSIS — T788XXS Other adverse effects, not elsewhere classified, sequela: Secondary | ICD-10-CM

## 2013-08-19 DIAGNOSIS — T7589XS Other specified effects of external causes, sequela: Secondary | ICD-10-CM

## 2013-08-19 MED ORDER — LORAZEPAM 2 MG/ML IJ SOLN: 0.5000 mg | INTRAMUSCULAR | Status: DC | PRN

## 2013-08-19 MED ORDER — FAMOTIDINE 20 MG PO TABS
20.0000 mg | ORAL_TABLET | Freq: Two times a day (BID) | ORAL | Status: DC
  Administered 2013-08-19 – 2013-08-21 (×4): 20 mg via ORAL
  Filled 2013-08-19 (×5): qty 1

## 2013-08-19 MED ORDER — LABETALOL HCL 5 MG/ML IV SOLN
10.0000 mg | Freq: Once | INTRAVENOUS | Status: AC
  Administered 2013-08-19: 10 mg via INTRAVENOUS
  Filled 2013-08-19: qty 4

## 2013-08-19 MED ORDER — FLUOXETINE HCL 20 MG PO CAPS
20.0000 mg | ORAL_CAPSULE | Freq: Every day | ORAL | Status: DC
  Administered 2013-08-19 – 2013-08-21 (×3): 20 mg via ORAL
  Filled 2013-08-19 (×4): qty 1

## 2013-08-19 MED ORDER — VITAMIN B-1 100 MG PO TABS
100.0000 mg | ORAL_TABLET | Freq: Every day | ORAL | Status: DC
  Administered 2013-08-20 – 2013-08-21 (×2): 100 mg via ORAL
  Filled 2013-08-19 (×2): qty 1

## 2013-08-19 MED ORDER — ENSURE COMPLETE PO LIQD
237.0000 mL | Freq: Two times a day (BID) | ORAL | Status: DC
  Administered 2013-08-19 – 2013-08-21 (×5): 237 mL via ORAL

## 2013-08-19 MED ORDER — FOLIC ACID 1 MG PO TABS
1.0000 mg | ORAL_TABLET | Freq: Every day | ORAL | Status: DC
  Administered 2013-08-20 – 2013-08-21 (×2): 1 mg via ORAL
  Filled 2013-08-19 (×2): qty 1

## 2013-08-19 MED ORDER — CLONIDINE HCL 0.2 MG/24HR TD PTWK
0.2000 mg | MEDICATED_PATCH | TRANSDERMAL | Status: DC
  Administered 2013-08-19: 0.2 mg via TRANSDERMAL
  Filled 2013-08-19: qty 1

## 2013-08-19 MED ORDER — METOPROLOL TARTRATE 12.5 MG HALF TABLET
12.5000 mg | ORAL_TABLET | Freq: Two times a day (BID) | ORAL | Status: DC
  Administered 2013-08-19 – 2013-08-21 (×4): 12.5 mg via ORAL
  Filled 2013-08-19 (×5): qty 1

## 2013-08-19 MED ORDER — METOPROLOL TARTRATE 12.5 MG HALF TABLET: 12.5000 mg | ORAL_TABLET | Freq: Two times a day (BID) | ORAL | Status: DC

## 2013-08-19 NOTE — Progress Notes (Signed)
PULMONARY / CRITICAL CARE MEDICINE   Name: Richard Dunn MRN: [id number] DOB: 12/05/1960    ADMISSION DATE:  08/14/2013  REFERRING MD : Benjiman CoreNathan Pickering  CHIEF COMPLAINT:  Difficulty swallowing  BRIEF PATIENT DESCRIPTION:  53 yo male with hx of ETOH presented to behavioral health for alcohol detox.  He was given lisinopril for hypertension.  Developed throat swelling and difficulty breathing.  PCCM asked to admit to ICU.  SIGNIFICANT EVENTS: 6/17 Admit, ENT evaluation in ER 6/18 intubated due to agitation 6/20 extubated, restarted precedex, failed swallow evaluation 6/21 Off Precedex, improved agitation, alert/oriented  STUDIES:  6/17 Bedside laryngoscopy >> palatal/supraglottic edema, vocal cords mobile  LINES / TUBES: ETT 6/18 >> 6/20 CVL 6/18 >>  SUBJECTIVE:  Off precedex, improved agitation RASS 0, plan for repeat swallow eval this AM  VITAL SIGNS: Temp:  [99 F (37.2 C)-99.9 F (37.7 C)] 99.3 F (37.4 C) (06/22 0800) Pulse Rate:  [51-96] 96 (06/22 0800) Resp:  [0-20] 20 (06/22 0426) BP: (109-186)/(56-102) 143/85 mmHg (06/22 0800) SpO2:  [98 %-100 %] 99 % (06/22 0800) Weight:  [64.3 kg (141 lb 12.1 oz)] 64.3 kg (141 lb 12.1 oz) (06/22 0400) INTAKE / OUTPUT: Intake/Output     06/21 0701 - 06/22 0700 06/22 0701 - 06/23 0700   I.V. (mL/kg) 1784.7 (27.8)    Other 110    NG/GT     IV Piggyback 100    Total Intake(mL/kg) 1994.7 (31)    Urine (mL/kg/hr) 2225 (1.4)    Emesis/NG output     Total Output 2225     Net -230.3            PHYSICAL EXAMINATION: General: no distress, not agitated, no visible tremors Neuro: RASS 0, calm, interactive, oriented to person/place HEENT: no sinus tenderness Cardiovascular:  Regular, no murmur Lungs:  No wheeze/rales Abdomen:  Soft, non tender, + bowel sounds Musculoskeletal:  No edema Skin: no rashes  LABS:  CBC  Recent Labs Lab 08/16/13 0515 08/17/13 0500 08/18/13 0515  WBC 13.2* 10.6* 6.3  HGB 13.0 12.2* 14.0   HCT 37.9* 38.0* 41.6  PLT 128* 135* 168   BMET  Recent Labs Lab 08/16/13 0515 08/17/13 0500 08/18/13 0515  NA 142 145 139  K 3.8 3.7 3.8  CL 108 111 101  CO2 23 23 26   BUN 6 7 4*  CREATININE 0.54 0.58 0.56  GLUCOSE 146* 109* 91   Electrolytes  Recent Labs Lab 08/15/13 0312  08/16/13 0515 08/17/13 0500 08/18/13 0515  CALCIUM  --   < > 8.4 8.2* 8.6  MG 1.4*  < > 2.0 1.8 1.8  PHOS 1.7*  --   --  3.2 3.8  < > = values in this interval not displayed.  Liver Enzymes  Recent Labs Lab 08/14/13 2230 08/16/13 0515 08/17/13 0500  AST 92* 49* 39*  ALT 64* 43 39  ALKPHOS 122* 84 78  BILITOT 1.2 0.4 0.2*  ALBUMIN 3.3* 2.8* 2.6*   Glucose  Recent Labs Lab 08/16/13 1220 08/16/13 1540 08/16/13 1958 08/16/13 2321 08/17/13 0325 08/17/13 0749  GLUCAP 110* 183* 121* 109* 108* 96    Imaging No results found.  ASSESSMENT / PLAN:  PULMONARY A: Acute respiratory failure due to altered mental status and complicated  angioedema most likely from ACE inhibitor. P:   Monitor oxygenation Bronchial hygiene  CARDIOVASCULAR A:  Sinus tachycardia 2nd to ETOH withdrawal >> improved. Hx of HTN. Prolonged Qt. P:  Add clonidine patch  PRN hydralazine, labetalol IV  until able to swallow  RENAL A:   Hypomagnesemia, hypophosphatemia > improving P:   F/u CMP, mag and phos 6/23 Replace electrolytes as needed  GASTROINTESTINAL A:   Elevated LFT's 2nd to ETOH. Nutrition. Dysphagia >> failed swallow evaluation 6/20. P:   NPO >> plan for swallow eval today Pepcid IV   HEMATOLOGIC A:   Thrombocytopenia likely 2nd to ETOH > improving P:  F/u CBC 6/23 SQ heparin for DVT prevention  INFECTIOUS A:   No evidence for infection. P:   Monitor for evidence of aspiration  ENDOCRINE A:   Steroid induced hyperglycemia > resolved P:   Monitor blood glucose PRN for signs and symptoms of hypo/hyperglycemia  NEUROLOGIC A:   Alcohol withdrawal. Hx of  depression. P:   Off precedex  Goal RASS 0  CIWA q4h PRN ativan for CIWA > 8 Continue thiamine and folate PT consult  Summary: Patient is more alert and oriented this morning and less agitated. Plan to have swallow eval this AM. Changes meds to PO if passes swallow. Plan to transfer to SDU status today. Back to triad 6/23  Elsie AmisJessica Koch ACNP student Anders SimmondsPete Babcock ACNP   Care during the described time interval was provided by me and/or other providers on the critical care team.  I have reviewed this patient's available data, including medical history, events of note, physical examination and test results as part of my evaluation  CC time x  35 minutes.   Cyril Mourningakesh Alva MD. Tonny BollmanFCCP. [facility] Pager [phone] If no response call [phone]

## 2013-08-19 NOTE — Progress Notes (Signed)
[id number]/Rhonda Earlene Plateravis, RN, BSN, CCM  [phone]  Chart Reviewed for discharge and hospital needs.  Discharge needs at time of review: None present will follow for needs.  Review of patient progress due on [id number].

## 2013-08-19 NOTE — Progress Notes (Signed)
PT Cancellation Note  Patient Details Name: Richard BrownsJames Louthan MRN: [id number] DOB: 04/25/1960   Cancelled Treatment:    Reason Eval/Treat Not Completed: Medical issues which prohibited therapy (at 1315 pt's HR in 130's and to get medication to lower. unable to return today to evaluate . PT will return in AM.)   Rada HayHill, Taejon Irani Elizabeth 08/19/2013, 5:52 PM Blanchard KelchKaren Kekoa Fyock PT [phone]

## 2013-08-19 NOTE — Progress Notes (Signed)
Speech Language Pathology Treatment: Dysphagia  Patient Details Name: Richard Dunn MRN: [id number] DOB: 11/26/1960 Today's Date: 08/19/2013 Time: 4098-11911058-1122 SLP Time Calculation (min): 24 min  Assessment / Plan / Recommendation Clinical Impression  Pt with much improved tolerance of po intake today.  Vocal quality remains hoarse but pt reports this is baseline due to h/o smoking, ETOH.  Swallow was timely with adequate laryngeal elevated palpated at bedside without indication of oropharyngeal stasis.   Baseline cough noted prior to po and during - but do not suspect coorelated to swallowing.  Pt admits to baseline cough even when talking prior to admission. He states swallow ability is currently normal for him.     As pt has no upper dentition, recommend to advance diet to mechanical soft/ *Dys3/ground meats/thin with strict precautions.    Pt admits to having a hard time with burger from [employer] when edema began.  SLP educated pt to recommendations and findings and will follow up x1 to assure tolerance and readiness for further advancement.    HPI HPI: 53 [facility] requiring intubation s/p extubation 6/20.  BSE ordered.  Pt with palatal and supraglottic edema but vocal cords were mobile.  PSH + for ACDF surgery C3-C7 in 2009 - and he reports acute dysphagia immediately after surgery that resolved.   BSE done on Saturday 6/20 indicating concern for airway protection due to lethargy and concern for ongoing edema.  Follow up today to assess for readiness for po advancement.    Pertinent Vitals Afebrile, decreased  SLP Plan  Continue with current plan of care    Recommendations Diet recommendations: Dysphagia 3 (mechanical soft);Thin liquid Liquids provided via: Cup;Straw Medication Administration: Whole meds with liquid (as tolerated) Supervision: Patient able to self feed;Intermittent supervision to cue for compensatory strategies Compensations: Slow rate;Small  sips/bites Postural Changes and/or Swallow Maneuvers: Seated upright 90 degrees;Upright 30-60 min after meal              Oral Care Recommendations: Oral care BID Follow up Recommendations: None (tbd, do not anticipate follow up will be indicated) Plan: Continue with current plan of care    GO     Donavan Burnetamara Kimball, MS [facility] SLP [phone]

## 2013-08-19 NOTE — Progress Notes (Addendum)
NUTRITION FOLLOW UP   DOCUMENTATION CODES Per approved criteria  -Mild/moderate malnutrition related to acute illness.   INTERVENTION:  Dysphagia 3 diet with ground meats and thin liquids  Ensure Complete po BID, each supplement provides 350 kcal and 13 grams of protein  Educated patient on poor nutritional status related to acute illness and etoh abuse and importance of maintaining sobriety and increasing nutrition.  Patient verbalized AA guidelines of not getting too hungry or too tired.  RD to follow.  NUTRITION DIAGNOSIS: Inadequate oral intake related to inability to eat as evidenced by npo status. - resolved.  New dx:  Inadequate oral intake related to dysphagia AEB difficulty swallowing.  Goal: Meet >90% estimated needs.-not yet met  Monitor:  Plan of care, labs, weight trend, diet initiation.  Reason for Assessment: vent  53 y.o. male  Admitting Dx: <principal problem not specified>  ASSESSMENT: 6/19: Patient admitted from [facility] (etoh detox) secondary to throat swelling most likely secondary to reaction to ACE inhibitor.  Now receiving mechanical ventilation.  Patient's diet hx unknown.  Expect poor prior to [facility] admit secondary to etoh abuse.  6/22:   -Patient with a weight loss from 146 6/19 to 141 lbs today.  Weight loss of 5 lbs (3%). -Patient to begin diet.  Seen by SLP today with diet recommendations. -Patient now meets criteria for mild/moderate malnutrition related to acute illness AEB weight loss of 3% in <1 week and mild decrease of muscle mass.   Height: Ht Readings from Last 1 Encounters:  08/15/13 5' 9"  (1.753 m)    Weight: Wt Readings from Last 1 Encounters:  08/19/13 141 lb 12.1 oz (64.3 kg)    Ideal Body Weight:  160 lbs  % Ideal Body Weight: 91  Wt Readings from Last 10 Encounters:  08/19/13 141 lb 12.1 oz (64.3 kg)  09/12/08 188 lb (85.276 kg)    Usual Body Weight: 188 lbs 5 years ago  % Usual Body Weight: 78% of weight 5 years  ago  BMI:  Body mass index is 20.92 kg/(m^2).  Estimated Nutritional Needs: Kcal: 1768 Protein: 95-105 gm Fluid:  >/=1.7L  Skin: intact  Diet Order: Dysphagia  EDUCATION NEEDS: -No education needs identified at this time   Intake/Output Summary (Last 24 hours) at 08/19/13 1248 Last data filed at 08/19/13 1048  Gross per 24 hour  Intake 1511.6 ml  Output   2425 ml  Net -913.4 ml    Last BM: unknown   Labs:   Recent Labs Lab 08/15/13 0312  08/16/13 0515 08/17/13 0500 08/18/13 0515  NA  --   < > 142 145 139  K  --   < > 3.8 3.7 3.8  CL  --   < > 108 111 101  CO2  --   < > 23 23 26   BUN  --   < > 6 7 4*  CREATININE  --   < > 0.54 0.58 0.56  CALCIUM  --   < > 8.4 8.2* 8.6  MG 1.4*  < > 2.0 1.8 1.8  PHOS 1.7*  --   --  3.2 3.8  GLUCOSE  --   < > 146* 109* 91  < > = values in this interval not displayed.  CBG (last 3)   Recent Labs  08/16/13 2321 08/17/13 0325 08/17/13 0749  GLUCAP 109* 108* 96    Scheduled Meds: . antiseptic oral rinse  15 mL Mouth Rinse q12n4p  . chlorhexidine  15 mL Mouth  Rinse BID  . cloNIDine  0.2 mg Transdermal Weekly  . famotidine (PEPCID) IV  20 mg Intravenous Q12H  . folic acid  1 mg Intravenous Daily  . heparin subcutaneous  5,000 Units Subcutaneous 3 times per day  . thiamine IV  100 mg Intravenous Daily    Continuous Infusions: . dextrose 5 % and 0.45 % NaCl with KCl 20 mEq/L 75 mL/hr at 08/18/13 1939    Past Medical History  Diagnosis Date  . Hypertension   . Degenerative disc disease   . Depression     Past Surgical History  Procedure Laterality Date  . Cervical spine surgery      Antonieta Iba, RD, LDN Clinical Inpatient Dietitian Pager:  [phone] Weekend and after hours pager:  [phone]

## 2013-08-20 LAB — BASIC METABOLIC PANEL
BUN: 8 mg/dL (ref 6–23)
CO2: 22 mEq/L (ref 19–32)
CREATININE: 0.63 mg/dL (ref 0.50–1.35)
Calcium: 9.1 mg/dL (ref 8.4–10.5)
Chloride: 99 mEq/L (ref 96–112)
GFR calc Af Amer: >90 mL/min (ref >90)
GLUCOSE: 107 mg/dL — AB (ref 70–99)
Potassium: 4.2 mEq/L (ref 3.7–5.3)
Sodium: 135 mEq/L — ABNORMAL LOW (ref 137–147)

## 2013-08-20 LAB — PHOSPHORUS: Phosphorus: 3.4 mg/dL (ref 2.3–4.6)

## 2013-08-20 LAB — CBC
HEMATOCRIT: 42.8 % (ref 39.0–52.0)
Hemoglobin: 15.1 g/dL (ref 13.0–17.0)
MCH: 34.7 pg — AB (ref 26.0–34.0)
MCHC: 35.3 g/dL (ref 30.0–36.0)
MCV: 98.4 fL (ref 78.0–100.0)
Platelets: 244 10*3/uL (ref 150–400)
RBC: 4.35 MIL/uL (ref 4.22–5.81)
RDW: 12.7 % (ref 11.5–15.5)
WBC: 7.4 10*3/uL (ref 4.0–10.5)

## 2013-08-20 LAB — MAGNESIUM: Magnesium: 2 mg/dL (ref 1.5–2.5)

## 2013-08-20 MED ORDER — HYDRALAZINE HCL 20 MG/ML IJ SOLN
INTRAMUSCULAR | Status: AC
  Filled 2013-08-20: qty 1

## 2013-08-20 MED ORDER — HYDRALAZINE HCL 20 MG/ML IJ SOLN
10.0000 mg | INTRAMUSCULAR | Status: DC | PRN
  Administered 2013-08-20: 10 mg via INTRAVENOUS
  Filled 2013-08-20: qty 0.5

## 2013-08-20 MED ORDER — ZOLPIDEM TARTRATE 5 MG PO TABS
5.0000 mg | ORAL_TABLET | Freq: Every evening | ORAL | Status: DC | PRN
  Administered 2013-08-21: 5 mg via ORAL
  Filled 2013-08-20: qty 1

## 2013-08-20 NOTE — Progress Notes (Signed)
Speech Language Pathology Treatment: Dysphagia  Patient Details Name: Richard Dunn MRN: [id number] DOB: 03-20-1960 Today's Date: 08/20/2013 Time: 8909-7529 SLP Time Calculation (min): 16 min  Assessment / Plan / Recommendation Clinical Impression  Resolution of pt's dysphagia due to angioedema noted - pt continues with hoarse voice and chronic cough which is his baseline per his statement.   As pt is partially edentulous and has h/o ACDF (09) resulting in transient dysphagia, therefore recommend continue soft/thin diet.  Pt reports preference for ground meats at this time.   General aspiration precautions indicated.      SLP reviewed importance of oral care for pulmonary health with patient and no further SLP warranted.     HPI HPI: 53 yo male adm to [facility] with angioedema requiring intubation s/p extubation 6/20.  BSE ordered.  Pt with palatal and supraglottic edema but vocal cords were mobile.  PSH + for ACDF surgery C3-C7 in 2009 - and he reports acute dysphagia immediately after surgery that resolved.   BSE done on Saturday 6/20 indicating concern for airway protection due to lethargy and concern for ongoing edema.  Follow up today to assure tolerance of po advancement.     Pertinent Vitals Afebrile, decreased  SLP Plan  All goals met;Discharge SLP treatment due to (comment) (goals met)    Recommendations Liquids provided via: Cup;Straw Medication Administration: Whole meds with liquid (as tolerated) Supervision: Patient able to self feed;Intermittent supervision to cue for compensatory strategies Compensations: Slow rate;Small sips/bites Postural Changes and/or Swallow Maneuvers: Seated upright 90 degrees;Upright 30-60 min after meal              Oral Care Recommendations: Oral care BID Follow up Recommendations: None Plan: All goals met;Discharge SLP treatment due to (comment) (goals met)    Hall, [facility] SLP [phone]

## 2013-08-20 NOTE — Progress Notes (Signed)
eLink Physician-Brief Progress Note Patient Name: Richard BrownsJames Dunn DOB: 11/06/1960 MRN: [id number]  Date of Service  08/20/2013   HPI/Events of Note  Started back on oral meds for hypotension including BB along with clonidine patich of 0.2.  PRNs including labetalol and hydralazine d/ced.  Now with BP of 180/105 with no agitation.   eICU Interventions  Plan: PRN hydralazine ordered (HR of 62) Goal BP of less than 170 mmHg   Intervention Category Intermediate Interventions: Hypertension - evaluation and management  DETERDING,ELIZABETH 08/20/2013, 12:26 AM

## 2013-08-20 NOTE — Care Management Note (Addendum)
    Page 1 of 2   08/21/2013     3:29:42 PM CARE MANAGEMENT NOTE 08/21/2013  Patient:  Richard Dunn,Richard Dunn   Account Number:  [id number]  Date Initiated:  08/15/2013  Documentation initiated by:  DAVIS,RHONDA  Subjective/Objective Assessment:   53 yo male with hx of ETOH presented to behavioral health for alcohol detox.  He was given lisinopril for hypertension.  Developed throat swelling and difficulty breathing     Action/Plan:   bhh when stable   Anticipated DC Date:  08/21/2013   Anticipated DC Plan:  HOME/SELF CARE  In-house referral  Clinical Social Worker      DC Planning Services  CM consult      [facility]   Choice offered to / List presented to:  NA      DME agency  NA     HH arranged  NA      HH agency  NA   Status of service:  Completed, signed off Medicare Important Message given?  YES (If response is "NO", the following Medicare IM given date fields will be blank) Date Medicare IM given:  08/21/2013 Date Additional Medicare IM given:    Discharge Disposition:  HOME/SELF CARE  Per UR Regulation:  Reviewed for med. necessity/level of care/duration of stay  If discussed at Long Length of Stay Meetings, dates discussed:   08/20/2013    Comments:  08/21/13 KATHY MAHABIR RN,BSN NCM [phone] D/C HOME NO NEEDS.  08/20/13 KATHY MAHABIR RN,BSN NCM [phone] TRANSFER FROM SDU.PT-NO F/U.ETOH- CIWA PROTOCAL.NO ANTICIPATED D/C NEEDS.  [id number]/Rhonda Earlene Plateravis, RN, BSN, [place] [phone] Chart Reviewed for discharge and hospital needs. Discharge needs at time of review: None present will follow for needs. Review of patient progress due on [id number].   [id number]/Rhonda Earlene Plateravis, RN, BSN, CCM: Chart review for discharge needs: No needs present at time of this review. Next review due on [id number].

## 2013-08-20 NOTE — Progress Notes (Signed)
[facility] PROGRESS NOTE  Hoover BrownsJames Gabrys GUY:[id number] DOB: 06/26/1960 DOA: 08/14/2013 PCP: Elias Elseobert Reade,   Brief narrative 53 y/o male with etoh abuse was admitted to behavior health for etoh detox. He was started on lisinopril for hypertension and developed angioedema with throat swelling and difficulty breathing.   Hospital course: 6/17 Admit, ENT evaluation in ER  6/18 intubated due to agitation  6/20 extubated, restarted precedex, failed swallow evaluation  6/21 Off Precedex, improved agitation, alert/oriented 6/23 improved and transferred to [facility]   Assessment/Plan: Acute respiratory failure with Angioedema  likely secondary to ACEi  requiring intubation. Now stable. ACE and ARB listed as allergy/ contraindication on pts medication. Stable on stepdown  seen by swallow eval and  recommend dys level 3 diet  etoh withdrawal  off precedex drip . Now has CIWA of 0. transfer to tele. Plans to quit drinking. Continue pepcid, thiamine and folate PT eval  Hypomagnesemia and hypophosphatemia Stable  hypertension  BP elevated overnight. On low dose metoprolol bid and clonidine patch.  prn hydralazine added and currently stable  Tobacco abuse  will place nicotine patch. counseled on cessation  DVT prophylaxis: sq heparin  Diet: dys level 3    Code Status: full Family Communication: none Disposition Plan: transfer to telemetry . home possibly in 1-2 days   Consultants:  PCCM  ENT  Procedures:  Intubation    Antibiotics:  none  HPI/Subjective: Patient seen and examined this morning. Denies any symptoms. [facility]   Objective: Filed Vitals:   08/20/13 0600  BP: 148/78  Pulse: 71  Temp:   Resp: 17    Intake/Output Summary (Last 24 hours) at 08/20/13 0845 Last data filed at 08/20/13 0556  Gross per 24 hour  Intake 1513.34 ml  Output   3065 ml  Net -1551.66 ml   Filed Weights   08/17/13 0400  08/19/13 0400 08/20/13 0400  Weight: 65.1 kg (143 lb 8.3 oz) 64.3 kg (141 lb 12.1 oz) 63.2 kg (139 lb 5.3 oz)    Exam:   General:  Middle aged male in no acute distress  HEENT: No pallor, moist oral mucosa  Chest: Clear to auscultation bilaterally, no added sounds  CVS: Normal S1-S2, no murmurs rub or gallop  Abdomen: Soft, nontender, nondistended, bowel sounds present  Extremities: Warm, no edema  CNS: AAO x3, no tremors  Data Reviewed: Basic Metabolic Panel:  Recent Labs Lab 08/15/13 0312 08/15/13 1600 08/15/13 2130 08/16/13 0515 08/17/13 0500 08/18/13 0515 08/20/13 0652  NA  --   --  141 142 145 139 135*  K  --   --  3.8 3.8 3.7 3.8 4.2  CL  --   --  106 108 111 101 99  CO2  --   --  25 23 23 26 22   GLUCOSE  --   --  119* 146* 109* 91 107*  BUN  --   --  7 6 7  4* 8  CREATININE  --   --  0.55 0.54 0.58 0.56 0.63  CALCIUM  --   --  8.5 8.4 8.2* 8.6 9.1  MG 1.4* 2.2  --  2.0 1.8 1.8 2.0  PHOS 1.7*  --   --   --  3.2 3.8 3.4   Liver Function Tests:  Recent Labs Lab 08/13/13 1449 08/14/13 1432 08/14/13 2230 08/16/13 0515 08/17/13 0500  AST 132* 104* 92* 49* 39*  ALT 77* 70* 64* 43 39  ALKPHOS  126* 125* 122* 84 78  BILITOT 0.4 1.0 1.2 0.4 0.2*  PROT 7.6 7.4 7.3 6.1 5.5*  ALBUMIN 3.4* 3.3* 3.3* 2.8* 2.6*   No results found for this basename: LIPASE, AMYLASE,  in the last 168 hours No results found for this basename: AMMONIA,  in the last 168 hours CBC:  Recent Labs Lab 08/13/13 1449 08/14/13 2230 08/15/13 0312 08/16/13 0515 08/17/13 0500 08/18/13 0515 08/20/13 0652  WBC 5.6 7.2 7.0 13.2* 10.6* 6.3 7.4  NEUTROABS 3.9 5.6  --   --   --   --   --   HGB 14.9 15.7 15.2 13.0 12.2* 14.0 15.1  HCT 42.3 44.8 44.1 37.9* 38.0* 41.6 42.8  MCV 97.9 98.7 99.8 100.8* 105.0* 101.2* 98.4  PLT 148* 139* 122* 128* 135* 168 244   Cardiac Enzymes:  Recent Labs Lab 08/15/13 1835 08/16/13 1030 08/16/13 1639 08/16/13 2200 08/17/13 0500  CKTOTAL  --   --    --   --  57  CKMB  --   --   --   --  <1.0  TROPONINI <0.30 <0.30 <0.30 <0.30  --    BNP (last 3 results) No results found for this basename: PROBNP,  in the last 8760 hours CBG:  Recent Labs Lab 08/16/13 1540 08/16/13 1958 08/16/13 2321 08/17/13 0325 08/17/13 0749  GLUCAP 183* 121* 109* 108* 96    Recent Results (from the past 240 hour(s))  RAPID STREP SCREEN     Status: None   Collection Time    08/14/13  9:54 PM      Result Value Ref Range Status   Streptococcus, Group A Screen (Direct) NEGATIVE  NEGATIVE Final   Comment: (NOTE)     A Rapid Antigen test may result negative if the antigen level in the     sample is below the detection level of this test. The FDA has not     cleared this test as a stand-alone test therefore the rapid antigen     negative result has reflexed to a Group A Strep culture.  CULTURE, GROUP A STREP     Status: None   Collection Time    08/14/13  9:54 PM      Result Value Ref Range Status   Specimen Description THROAT   Final   Special Requests NONE   Final   Culture     Final   Value: No Beta Hemolytic Streptococci Isolated     Performed at [facility]   Report Status 08/16/2013 FINAL   Final  MRSA PCR SCREENING     Status: None   Collection Time    08/15/13 12:33 AM      Result Value Ref Range Status   MRSA by PCR NEGATIVE  NEGATIVE Final   Comment:            The GeneXpert MRSA Assay (FDA     approved for NASAL specimens     only), is one component of a     comprehensive MRSA colonization     surveillance program. It is not     intended to diagnose MRSA     infection nor to guide or     monitor treatment for     MRSA infections.     Studies: No results found.  Scheduled Meds: . cloNIDine  0.2 mg Transdermal Weekly  . famotidine  20 mg Oral BID  . feeding supplement (ENSURE COMPLETE)  237 mL Oral BID BM  .  FLUoxetine  20 mg Oral Daily  . folic acid  1 mg Oral Daily  . heparin subcutaneous  5,000 Units Subcutaneous  3 times per day  . hydrALAZINE      . metoprolol tartrate  12.5 mg Oral BID  . thiamine  100 mg Oral Daily   Continuous Infusions: . dextrose 5 % and 0.45 % NaCl with KCl 20 mEq/L 50 mL/hr at 08/20/13 0408     Time spent: 25 minutes    Nicholas Ossa  [facility] Pager [phone]. If 7PM-7AM, please contact night-coverage at [email or web address], password [email or web address] 08/20/2013, 8:45 AM  LOS: 6 days

## 2013-08-20 NOTE — Evaluation (Signed)
Physical Therapy Evaluation Patient Details Name: Hoover BrownsJames Reimers MRN: [id number] DOB: 03/29/1960 Today's Date: 08/20/2013   History of Present Illness    53 y/o male with etoh abuse was admitted to behavior health for etoh detox. He was started on lisinopril for hypertension and developed angioedema with throat swelling and difficulty breathing   Clinical Impression  Pt pleasant and cooperative and mobilizing currently at min guard for safety level of assist.  Pt should progress to IND ambulation by point of d/c from hospital.    Follow Up Recommendations No PT follow up    Equipment Recommendations  None recommended by PT    Recommendations for Other Services       Precautions / Restrictions Precautions Precautions: Fall Restrictions Weight Bearing Restrictions: No      Mobility  Bed Mobility Overal bed mobility: Modified Independent                Transfers Overall transfer level: Needs assistance Equipment used: None Transfers: Sit to/from Stand Sit to Stand: Min guard         General transfer comment: cues for transition position and use of UEs for saftey  Ambulation/Gait Ambulation/Gait assistance: Min assist;Min guard Ambulation Distance (Feet): 400 Feet Assistive device: None Gait Pattern/deviations: Step-through pattern;Shuffle Gait velocity: moderate   General Gait Details: Initial mild instability corrected with increased distance walked to point of min guard for saftey  Stairs            Wheelchair Mobility    Modified Rankin (Stroke Patients Only)       Balance                                             Pertinent Vitals/Pain No c/o pain at this time    Home Living Family/patient expects to be discharged to:: Private residence Living Arrangements: Non-relatives/Friends Available Help at Discharge: Friend(s) Type of Home: Apartment Home Access: Level entry     Home Layout: One level Home Equipment:  None Additional Comments: Pt states he plans to stay with friends who will help him to stay sober    Prior Function Level of Independence: Independent               Hand Dominance        Extremity/Trunk Assessment   Upper Extremity Assessment: Overall WFL for tasks assessed           Lower Extremity Assessment: Overall WFL for tasks assessed         Communication   Communication: No difficulties  Cognition Arousal/Alertness: Awake/alert Behavior During Therapy: WFL for tasks assessed/performed Overall Cognitive Status: Within Functional Limits for tasks assessed                      General Comments      Exercises        Assessment/Plan    PT Assessment Patient needs continued PT services  PT Diagnosis Difficulty walking   PT Problem List Decreased balance;Decreased mobility;Decreased safety awareness  PT Treatment Interventions Gait training;Balance training;Functional mobility training;Therapeutic activities   PT Goals (Current goals can be found in the Care Plan section) Acute Rehab PT Goals Patient Stated Goal: HOme with friends and stay sober. PT Goal Formulation: With patient Time For Goal Achievement: 08/27/13 Potential to Achieve Goals: Good    Frequency Min 3X/week   Barriers  to discharge        Co-evaluation               End of Session Equipment Utilized During Treatment: Gait belt Activity Tolerance: Patient tolerated treatment well Patient left: in chair;with call bell/phone within reach;with chair alarm set Nurse Communication: Mobility status         Time: [id number] PT Time Calculation (min): 20 min   Charges:   PT Evaluation $Initial PT Evaluation Tier I: 1 Procedure PT Treatments $Gait Training: 8-22 mins   PT G Codes:          BRADSHAW,HUNTER 08/20/2013, 11:47 AM

## 2013-08-20 NOTE — Progress Notes (Signed)
Pt transferring to 1409. Full report given to RN Melissa.

## 2013-08-21 MED ORDER — CLONIDINE HCL 0.2 MG/24HR TD PTWK: 0.2000 mg | MEDICATED_PATCH | TRANSDERMAL | Status: DC

## 2013-08-21 MED ORDER — FLUOXETINE HCL 20 MG PO CAPS: 20.0000 mg | ORAL_CAPSULE | Freq: Every day | ORAL | Status: DC

## 2013-08-21 MED ORDER — ONDANSETRON HCL 4 MG/2ML IJ SOLN
4.0000 mg | Freq: Four times a day (QID) | INTRAMUSCULAR | Status: DC | PRN
  Administered 2013-08-21: 4 mg via INTRAVENOUS
  Filled 2013-08-21: qty 2

## 2013-08-21 MED ORDER — METOPROLOL TARTRATE 12.5 MG HALF TABLET: 12.5000 mg | ORAL_TABLET | Freq: Two times a day (BID) | ORAL | Status: DC

## 2013-08-21 NOTE — Discharge Summary (Signed)
Physician Discharge Summary  Richard Dunn ZOX:[id number] DOB: 07-31-1960 DOA: 08/14/2013  PCP: No primary provider on file.  Admit date: 08/14/2013 Discharge date: 08/21/2013  Time spent: 35 minutes  Recommendations for Outpatient Follow-up:  1. Follow up with PCP in 1-2 weeks 2. Please note: pt has anaphylaxis to LISINOPRIL  Discharge Diagnoses:  Active Problems:   Angioedema   Encephalopathy acute   Acute respiratory failure with hypoxia   Hypomagnesemia   Hypophosphatemia   Discharge Condition: Improved  Diet recommendation: Regular  Filed Weights   08/17/13 0400 08/19/13 0400 08/20/13 0400  Weight: 143 lb 8.3 oz (65.1 kg) 141 lb 12.1 oz (64.3 kg) 139 lb 5.3 oz (63.2 kg)    History of present illness:  53 yo male with hx of ETOH was admitted to behavioral health for detox. He drinks alcohol, but also drinks listerine when he can't get alcohol. He was given lisinopril for high blood pressure. He then developed throat discomfort around 7 pm on 08/14/13. He was given decadron and transferred to [facility] ER. In the ER he c/o feeling short of breath. He was given solumedrol, pepcid, racemic epi neb treatment, and benadryl. He was seen by ENT and had bedside laryngoscopy done. He report feeling improvement with his breathing. PCCM was asked to admit to the ICU.  Hospital Course:  Acute respiratory failure with Angioedema  likely secondary to ACEi requiring intubation. Now stable. ACE and ARB listed as allergy/ contraindication on pts medication.  Transferred out of stepdown  seen by swallow eval and recommend dys level 3 diet etoh withdrawal  off precedex drip . Now has CIWA of 0. Plans to quit drinking.  Continue pepcid, thiamine and folate while inpatient  Hypomagnesemia and hypophosphatemia  Stable  hypertension  On low dose metoprolol bid and clonidine patch. prn hydralazine added and currently stable  Tobacco abuse  will place nicotine patch. counseled on cessation while  inpatient   Discharge Exam: Filed Vitals:   08/21/13 0115 08/21/13 0433 08/21/13 0948 08/21/13 1322  BP: 136/84 137/84 154/86 120/74  Pulse: 57 61 60 54  Temp: 98 F (36.7 C) 98.1 F (36.7 C)  98.3 F (36.8 C)  TempSrc: Oral Oral    Resp: 16 14    Height:      Weight:      SpO2: 100% 100%  100%    General: awake, in nad Cardiovascular: regular, s1, s2 Respiratory: normal, resp effort, no wheezing  Discharge Instructions     Medication List         cloNIDine 0.2 mg/24hr patch  Commonly known as:  CATAPRES - Dosed in mg/24 hr  Place 1 patch (0.2 mg total) onto the skin once a week.     FLUoxetine 20 MG capsule  Commonly known as:  PROZAC  Take 20 mg by mouth daily.     FLUoxetine 20 MG capsule  Commonly known as:  PROZAC  Take 1 capsule (20 mg total) by mouth daily.     metoprolol tartrate 12.5 mg Tabs tablet  Commonly known as:  LOPRESSOR  Take 0.5 tablets (12.5 mg total) by mouth 2 (two) times daily.     naltrexone 50 MG tablet  Commonly known as:  DEPADE  Take 50 mg by mouth daily.       Allergies  Allergen Reactions  . Lisinopril Swelling    Angioedema  . Other     Mycin Drugs  . Sulfonamide Derivatives Other (See Comments)    Childhood allergy  Follow-up Information   Schedule an appointment as soon as possible for a visit with Follow up with your PCP in 1-2 weeks.       The results of significant diagnostics from this hospitalization (including imaging, microbiology, ancillary and laboratory) are listed below for reference.    Significant Diagnostic Studies: Dg Neck Soft Tissue  08/14/2013   CLINICAL DATA:  Short of breath.  Angioedema.  EXAM: NECK SOFT TISSUES - 1+ VIEW  COMPARISON:  12/02/2012  FINDINGS: Prevertebral soft tissue swelling is present. Prevertebral soft tissues anterior to C2 measure up to 23 mm. This represents a substantial interval change compared to prior CT 12/02/2012. No destructive osseous lesions. Long segment  cervical fusion that appears to extend from C3 through C7. Epiglottis grossly appears within normal limits allowing for posterior mass effect from prevertebral soft tissue swelling.  IMPRESSION: Marked prevertebral soft tissue swelling. This may be related to edema, prevertebral effusion / retropharyngeal infection.   Electronically Signed   By: Andreas NewportGeoffrey  Lamke M.D.   On: 08/14/2013 22:17   Portable Chest Xray In Am  08/16/2013   CLINICAL DATA:  Hypoxia  EXAM: PORTABLE CHEST - 1 VIEW  COMPARISON:  August 15, 2013  FINDINGS: Endotracheal tube tip is 3.4 cm above the carina. Central catheter tip is in the superior vena cava near the cavoatrial junction. Nasogastric tube tip and side port are in the stomach. No pneumothorax. There is no edema or consolidation. The heart size and pulmonary vascularity are within normal limits. No adenopathy. There is postoperative change in the cervical spine. There is arthropathy in each shoulder.  IMPRESSION: The tube and catheter positions as described without pneumothorax. Lungs clear.   Electronically Signed   By: Bretta BangWilliam  Woodruff M.D.   On: 08/16/2013 07:34   Portable Chest Xray  08/15/2013   CLINICAL DATA:  ET tube placement.  EXAM: PORTABLE CHEST - 1 VIEW  COMPARISON:  Single view of the chest 08/15/2013 at 12:05 a.m.  FINDINGS: Endotracheal tube is in place with the tip in good position 4.8 cm above the carina. A new right IJ catheter is also in place with the tip projecting in the mid to lower superior vena cava. Lungs are clear. Heart size is normal. No pneumothorax or pleural effusion. Marked degenerative change right glenohumeral joint noted.  IMPRESSION: ET tube and right IJ catheter project in good position. Negative for pneumothorax. Lungs clear.   Electronically Signed   By: Drusilla Kannerhomas  Dalessio M.D.   On: 08/15/2013 13:28   Dg Chest Port 1 View  08/15/2013   CLINICAL DATA:  Cough and congestion.  History of smoking.  EXAM: PORTABLE CHEST - 1 VIEW  COMPARISON:   Chest radiograph performed 08/14/2013  FINDINGS: The lungs are well-aerated and clear. There is no evidence of focal opacification, pleural effusion or pneumothorax.  The cardiomediastinal silhouette is within normal limits. No acute osseous abnormalities are seen. Degenerative change is noted at the right humeral head, with prominent osteophyte formation. Cervical spinal fusion hardware is partially imaged.  IMPRESSION: No acute cardiopulmonary process seen.   Electronically Signed   By: Roanna RaiderJeffery  Chang M.D.   On: 08/15/2013 00:44   Dg Chest Port 1 View  08/14/2013   CLINICAL DATA:  Short of breath.  Throat swelling.  EXAM: PORTABLE CHEST - 1 VIEW  COMPARISON:  06/27/2005.  FINDINGS: Cardiopericardial silhouette within normal limits. Mediastinal contours normal. Trachea midline. No airspace disease or effusion. Monitoring leads project over the chest. 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  IMPRESSION: No active disease.   Electronically Signed   By: Andreas Newport M.D.   On: 08/14/2013 22:14   Dg Abd Portable 1v  08/15/2013   CLINICAL DATA:  53 year old male with enteric tube placement. Initial encounter.  EXAM: PORTABLE ABDOMEN - 1 VIEW  COMPARISON:  Lumbar radiographs 12/08/2007.  FINDINGS: Portable AP supine view at 1446 hrs. Multiple EKG leads and wires overlie the chest. Enteric tube in place, looped in the left upper quadrant. Tip and side hole at the level of the gastric body.  Non obstructed bowel gas pattern. Negative visualized lung bases. No acute osseous abnormality identified.  IMPRESSION: Enteric tube placed, side hole at the level of the gastric body.   Electronically Signed   By: Augusto Gamble M.D.   On: 08/15/2013 14:57    Microbiology: Recent Results (from the past 240 hour(s))  RAPID STREP SCREEN     Status: None   Collection Time    08/14/13  9:54 PM      Result Value Ref Range Status   Streptococcus, Group A Screen (Direct) NEGATIVE  NEGATIVE  Final   Comment: (NOTE)     A Rapid Antigen test may result negative if the antigen level in the     sample is below the detection level of this test. The FDA has not     cleared this test as a stand-alone test therefore the rapid antigen     negative result has reflexed to a Group A Strep culture.  CULTURE, GROUP A STREP     Status: None   Collection Time    08/14/13  9:54 PM      Result Value Ref Range Status   Specimen Description THROAT   Final   Special Requests NONE   Final   Culture     Final   Value: No Beta Hemolytic Streptococci Isolated     Performed at [facility]   Report Status 08/16/2013 FINAL   Final  MRSA PCR SCREENING     Status: None   Collection Time    08/15/13 12:33 AM      Result Value Ref Range Status   MRSA by PCR NEGATIVE  NEGATIVE Final   Comment:            The GeneXpert MRSA Assay (FDA     approved for NASAL specimens     only), is one component of a     comprehensive MRSA colonization     surveillance program. It is not     intended to diagnose MRSA     infection nor to guide or     monitor treatment for     MRSA infections.     Labs: Basic Metabolic Panel:  Recent Labs Lab 08/15/13 0312 08/15/13 1600 08/15/13 2130 08/16/13 0515 08/17/13 0500 08/18/13 0515 08/20/13 0652  NA  --   --  141 142 145 139 135*  K  --   --  3.8 3.8 3.7 3.8 4.2  CL  --   --  106 108 111 101 99  CO2  --   --  25 23 23 26 22   GLUCOSE  --   --  119* 146* 109* 91 107*  BUN  --   --  7 6 7  4* 8  CREATININE  --   --  0.55 0.54 0.58 0.56 0.63  CALCIUM  --   --  8.5 8.4 8.2* 8.6 9.1  MG  1.4* 2.2  --  2.0 1.8 1.8 2.0  PHOS 1.7*  --   --   --  3.2 3.8 3.4   Liver Function Tests:  Recent Labs Lab 08/14/13 2230 08/16/13 0515 08/17/13 0500  AST 92* 49* 39*  ALT 64* 43 39  ALKPHOS 122* 84 78  BILITOT 1.2 0.4 0.2*  PROT 7.3 6.1 5.5*  ALBUMIN 3.3* 2.8* 2.6*   No results found for this basename: LIPASE, AMYLASE,  in the last 168 hours No results  found for this basename: AMMONIA,  in the last 168 hours CBC:  Recent Labs Lab 08/14/13 2230 08/15/13 0312 08/16/13 0515 08/17/13 0500 08/18/13 0515 08/20/13 0652  WBC 7.2 7.0 13.2* 10.6* 6.3 7.4  NEUTROABS 5.6  --   --   --   --   --   HGB 15.7 15.2 13.0 12.2* 14.0 15.1  HCT 44.8 44.1 37.9* 38.0* 41.6 42.8  MCV 98.7 99.8 100.8* 105.0* 101.2* 98.4  PLT 139* 122* 128* 135* 168 244   Cardiac Enzymes:  Recent Labs Lab 08/15/13 1835 08/16/13 1030 08/16/13 1639 08/16/13 2200 08/17/13 0500  CKTOTAL  --   --   --   --  57  CKMB  --   --   --   --  <1.0  TROPONINI <0.30 <0.30 <0.30 <0.30  --    BNP: BNP (last 3 results) No results found for this basename: PROBNP,  in the last 8760 hours CBG:  Recent Labs Lab 08/16/13 1540 08/16/13 1958 08/16/13 2321 08/17/13 0325 08/17/13 0749  GLUCAP 183* 121* 109* 108* 96   Signed:  CHIU, STEPHEN K  [facility] 08/21/2013, 3:10 PM

## 2013-11-05 ENCOUNTER — Emergency Department (HOSPITAL_COMMUNITY): Payer: PRIVATE HEALTH INSURANCE

## 2013-11-05 ENCOUNTER — Encounter (HOSPITAL_COMMUNITY): Payer: Self-pay | Admitting: Emergency Medicine

## 2013-11-05 ENCOUNTER — Emergency Department (HOSPITAL_COMMUNITY)
Admission: EM | Admit: 2013-11-05 | Discharge: 2013-11-05 | Disposition: A | Discharge status: Home / Self Care | Payer: PRIVATE HEALTH INSURANCE | Attending: Emergency Medicine | Admitting: Emergency Medicine

## 2013-11-05 DIAGNOSIS — I10 Essential (primary) hypertension: Secondary | ICD-10-CM | POA: Insufficient documentation

## 2013-11-05 DIAGNOSIS — F10920 Alcohol use, unspecified with intoxication, uncomplicated: Secondary | ICD-10-CM

## 2013-11-05 DIAGNOSIS — F3289 Other specified depressive episodes: Secondary | ICD-10-CM | POA: Insufficient documentation

## 2013-11-05 DIAGNOSIS — Z79899 Other long term (current) drug therapy: Secondary | ICD-10-CM | POA: Diagnosis not present

## 2013-11-05 DIAGNOSIS — F1010 Alcohol abuse, uncomplicated: Secondary | ICD-10-CM | POA: Insufficient documentation

## 2013-11-05 DIAGNOSIS — F1410 Cocaine abuse, uncomplicated: Secondary | ICD-10-CM | POA: Insufficient documentation

## 2013-11-05 DIAGNOSIS — F1210 Cannabis abuse, uncomplicated: Secondary | ICD-10-CM | POA: Insufficient documentation

## 2013-11-05 DIAGNOSIS — Z791 Long term (current) use of non-steroidal anti-inflammatories (NSAID): Secondary | ICD-10-CM | POA: Insufficient documentation

## 2013-11-05 DIAGNOSIS — F1910 Other psychoactive substance abuse, uncomplicated: Secondary | ICD-10-CM

## 2013-11-05 DIAGNOSIS — R4182 Altered mental status, unspecified: Secondary | ICD-10-CM | POA: Insufficient documentation

## 2013-11-05 DIAGNOSIS — F329 Major depressive disorder, single episode, unspecified: Secondary | ICD-10-CM | POA: Diagnosis not present

## 2013-11-05 DIAGNOSIS — F17200 Nicotine dependence, unspecified, uncomplicated: Secondary | ICD-10-CM | POA: Insufficient documentation

## 2013-11-05 DIAGNOSIS — Z8739 Personal history of other diseases of the musculoskeletal system and connective tissue: Secondary | ICD-10-CM | POA: Diagnosis not present

## 2013-11-05 LAB — CBC
HCT: 38.2 % — ABNORMAL LOW (ref 39.0–52.0)
HEMOGLOBIN: 13.4 g/dL (ref 13.0–17.0)
MCH: 35.4 pg — ABNORMAL HIGH (ref 26.0–34.0)
MCHC: 35.1 g/dL (ref 30.0–36.0)
MCV: 100.8 fL — ABNORMAL HIGH (ref 78.0–100.0)
Platelets: 161 10*3/uL (ref 150–400)
RBC: 3.79 MIL/uL — ABNORMAL LOW (ref 4.22–5.81)
RDW: 15.3 % (ref 11.5–15.5)
WBC: 3.5 10*3/uL — ABNORMAL LOW (ref 4.0–10.5)

## 2013-11-05 LAB — RAPID URINE DRUG SCREEN, HOSP PERFORMED
AMPHETAMINES: NOT DETECTED
BARBITURATES: NOT DETECTED
Benzodiazepines: NOT DETECTED
Cocaine: POSITIVE — AB
OPIATES: NOT DETECTED
Tetrahydrocannabinol: POSITIVE — AB

## 2013-11-05 LAB — COMPREHENSIVE METABOLIC PANEL
ALK PHOS: 112 U/L (ref 39–117)
ALT: 60 U/L — AB (ref 0–53)
ANION GAP: 17 — AB (ref 5–15)
AST: 131 U/L — ABNORMAL HIGH (ref 0–37)
Albumin: 3.1 g/dL — ABNORMAL LOW (ref 3.5–5.2)
BUN: 4 mg/dL — AB (ref 6–23)
CHLORIDE: 100 meq/L (ref 96–112)
CO2: 21 mEq/L (ref 19–32)
Calcium: 8.1 mg/dL — ABNORMAL LOW (ref 8.4–10.5)
Creatinine, Ser: 0.57 mg/dL (ref 0.50–1.35)
GFR calc Af Amer: >90 mL/min (ref >90)
GFR calc non Af Amer: >90 mL/min (ref >90)
GLUCOSE: 105 mg/dL — AB (ref 70–99)
POTASSIUM: 3.4 meq/L — AB (ref 3.7–5.3)
SODIUM: 138 meq/L (ref 137–147)
TOTAL PROTEIN: 6.8 g/dL (ref 6.0–8.3)

## 2013-11-05 LAB — ETHANOL: Alcohol, Ethyl (B): >498 mg/dL (ref 0–11)

## 2013-11-05 LAB — ACETAMINOPHEN LEVEL

## 2013-11-05 LAB — SALICYLATE LEVEL: Salicylate Lvl: 2.1 mg/dL — ABNORMAL LOW (ref 2.8–20.0)

## 2013-11-05 MED ORDER — SODIUM CHLORIDE 0.9 % IV BOLUS (SEPSIS): 1000.0000 mL | Freq: Once | INTRAVENOUS | Status: DC

## 2013-11-05 MED ORDER — SODIUM CHLORIDE 0.9 % IV BOLUS (SEPSIS)
500.0000 mL | Freq: Once | INTRAVENOUS | Status: AC
  Administered 2013-11-05: 500 mL via INTRAVENOUS

## 2013-11-05 NOTE — ED Notes (Signed)
Per EMS: pt is "out of it". Vitals WDL CBG 108. Pt was laying outside half way in the street and on the sidewalk. Pt wakes up then falls back asleep.

## 2013-11-05 NOTE — ED Notes (Signed)
Pt ambulated well without assistance. Pt said he still felt like he had "alcohol symptoms" but felt better than the last ambulation attempt.

## 2013-11-05 NOTE — Discharge Instructions (Signed)
Alcohol Intoxication  Alcohol intoxication occurs when the amount of alcohol that a person has consumed impairs his or her ability to mentally and physically function. Alcohol directly impairs the normal chemical activity of the brain. Drinking large amounts of alcohol can lead to changes in mental function and behavior, and it can cause many physical effects that can be harmful.   Alcohol intoxication can range in severity from mild to very severe. Various factors can affect the level of intoxication that occurs, such as the person's age, gender, weight, frequency of alcohol consumption, and the presence of other medical conditions (such as diabetes, seizures, or heart conditions). Dangerous levels of alcohol intoxication may occur when people drink large amounts of alcohol in a short period (binge drinking). Alcohol can also be especially dangerous when combined with certain prescription medicines or "recreational" drugs.  SIGNS AND SYMPTOMS  Some common signs and symptoms of mild alcohol intoxication include:  · Loss of coordination.  · Changes in mood and behavior.  · Impaired judgment.  · Slurred speech.  As alcohol intoxication progresses to more severe levels, other signs and symptoms will appear. These may include:  · Vomiting.  · Confusion and impaired memory.  · Slowed breathing.  · Seizures.  · Loss of consciousness.  DIAGNOSIS   Your health care provider will take a medical history and perform a physical exam. You will be asked about the amount and type of alcohol you have consumed. Blood tests will be done to measure the concentration of alcohol in your blood. In many places, your blood alcohol level must be lower than 80 mg/dL (0.08%) to legally drive. However, many dangerous effects of alcohol can occur at much lower levels.   TREATMENT   People with alcohol intoxication often do not require treatment. Most of the effects of alcohol intoxication are temporary, and they go away as the alcohol naturally  leaves the body. Your health care provider will monitor your condition until you are stable enough to go home. Fluids are sometimes given through an IV access tube to help prevent dehydration.   HOME CARE INSTRUCTIONS  · Do not drive after drinking alcohol.  · Stay hydrated. Drink enough water and fluids to keep your urine clear or pale yellow. Avoid caffeine.    · Only take over-the-counter or prescription medicines as directed by your health care provider.    SEEK MEDICAL CARE IF:   · You have persistent vomiting.    · You do not feel better after a few days.  · You have frequent alcohol intoxication. Your health care provider can help determine if you should see a substance use treatment counselor.  SEEK IMMEDIATE MEDICAL CARE IF:   · You become shaky or tremble when you try to stop drinking.    · You shake uncontrollably (seizure).    · You throw up (vomit) blood. This may be bright red or may look like black coffee grounds.    · You have blood in your stool. This may be bright red or may appear as a black, tarry, bad smelling stool.    · You become lightheaded or faint.    MAKE SURE YOU:   · Understand these instructions.  · Will watch your condition.  · Will get help right away if you are not doing well or get worse.  Document Released: 11/24/2004 Document Revised: 10/17/2012 Document Reviewed: 07/20/2012  [employer]® Patient Information ©2015 [employer]. This information is not intended to replace advice given to you by your health care provider. Make sure   you discuss any questions you have with your health care provider.

## 2013-11-05 NOTE — ED Notes (Signed)
Bed: JY78 Expected date:  Expected time:  Means of arrival:  Comments: EMS - ETOH, "passed out", Hx of ETOH

## 2013-11-05 NOTE — ED Provider Notes (Signed)
CSN: [id number]     Arrival date & time 11/05/13  1347 History   First MD Initiated Contact with Patient 11/05/13 1502     Chief Complaint  Patient presents with  . Alcohol Intoxication   Patient is a 53 y.o. male presenting with intoxication.  Alcohol Intoxication    Patient is a 53 y.o. Male who presents to the ED via EMS for probable intoxication.  Patient was found lying on the ground and was partially unresponsive.  Per EMS patient did smell like alcohol.  Patient is awake and speaking on interview.  He states that he has been drinking mouthwash all morning and has also been doing some drugs but would not specify which drug he was taking.  Patient has no complaints at this time.    Past Medical History  Diagnosis Date  . Hypertension   . Degenerative disc disease   . Depression    Past Surgical History  Procedure Laterality Date  . Cervical spine surgery     No family history on file. History  Substance Use Topics  . Smoking status: Current Some Day Smoker  . Smokeless tobacco: Not on file  . Alcohol Use: Yes    Review of Systems  Level 5 caveat secondary to intoxication.    Allergies  Lisinopril; Other; and Sulfonamide derivatives  Home Medications   Prior to Admission medications   Medication Sig Start Date End Date Taking? Authorizing Provider  cloNIDine (CATAPRES - DOSED IN MG/24 HR) 0.2 mg/24hr patch Place 1 patch (0.2 mg total) onto the skin once a week. 08/21/13   Jerald Kief, MD  FLUoxetine (PROZAC) 20 MG capsule Take 1 capsule (20 mg total) by mouth daily. 08/21/13   Jerald Kief, MD  metoprolol tartrate (LOPRESSOR) 12.5 mg TABS tablet Take 0.5 tablets (12.5 mg total) by mouth 2 (two) times daily. 08/21/13   Jerald Kief, MD  naltrexone (DEPADE) 50 MG tablet Take 50 mg by mouth daily.  08/06/13   Historical Provider, MD   BP 102/65  Pulse 61  Temp(Src) 97.8 F (36.6 C) (Oral)  Resp 18  SpO2 94% Physical Exam  Nursing note and vitals  reviewed. Constitutional: He appears well-developed and well-nourished. No distress.  HENT:  Head: Normocephalic and atraumatic.  Mouth/Throat: Oropharynx is clear and moist. No oropharyngeal exudate.  Eyes: Pupils are equal, round, and reactive to light. Right conjunctiva is injected. Left conjunctiva is injected. No scleral icterus. Right eye exhibits nystagmus. Left eye exhibits nystagmus.  Neck: Normal range of motion. Neck supple. No JVD present. No thyromegaly present.  Cardiovascular: Normal rate, regular rhythm, normal heart sounds and intact distal pulses.  Exam reveals no gallop and no friction rub.   No murmur heard. Pulmonary/Chest: Effort normal and breath sounds normal. No respiratory distress. He has no wheezes. He has no rales. He exhibits no tenderness.  Abdominal: Soft. Bowel sounds are normal. He exhibits no distension and no mass. There is no tenderness. There is no rebound and no guarding.  Musculoskeletal: Normal range of motion.  Lymphadenopathy:    He has no cervical adenopathy.  Neurological: He is alert. He has normal strength. No cranial nerve deficit or sensory deficit. Coordination normal.  Skin: Skin is warm and dry. He is not diaphoretic.    ED Course  Procedures (including critical care time) Labs Review Labs Reviewed  CBC - Abnormal; Notable for the following:    WBC 3.5 (*)    RBC 3.79 (*)  HCT 38.2 (*)    MCV 100.8 (*)    MCH 35.4 (*)    All other components within normal limits  COMPREHENSIVE METABOLIC PANEL - Abnormal; Notable for the following:    Potassium 3.4 (*)    Glucose, Bld 105 (*)    BUN 4 (*)    Calcium 8.1 (*)    Albumin 3.1 (*)    AST 131 (*)    ALT 60 (*)    Total Bilirubin <0.2 (*)    Anion gap 17 (*)    All other components within normal limits  ETHANOL - Abnormal; Notable for the following:    Alcohol, Ethyl (B) >498 (*)    All other components within normal limits  SALICYLATE LEVEL - Abnormal; Notable for the  following:    Salicylate Lvl 2.1 (*)    All other components within normal limits  URINE RAPID DRUG SCREEN (HOSP PERFORMED) - Abnormal; Notable for the following:    Cocaine POSITIVE (*)    Tetrahydrocannabinol POSITIVE (*)    All other components within normal limits  ACETAMINOPHEN LEVEL    Imaging Review Dg Chest 2 View  11/05/2013   CLINICAL DATA:  Alcohol intoxication  EXAM: CHEST  2 VIEW  COMPARISON:  08/16/2013  FINDINGS: The heart size and mediastinal contours are within normal limits. Both lungs are clear. Osteoarthritis of the right glenohumeral joint.  IMPRESSION: No active cardiopulmonary disease.   Electronically Signed   By: Elige Ko   On: 11/05/2013 17:05   Ct Head Wo Contrast  11/05/2013   CLINICAL DATA:  Unresponsive.  EXAM: CT HEAD WITHOUT CONTRAST  TECHNIQUE: Contiguous axial images were obtained from the base of the skull through the vertex without intravenous contrast.  COMPARISON:  12/22/2012.  FINDINGS: No intra-axial or extra-axial pathologic blood or fluid collection. Ulnar coronary infarct left thalamus. No acute abnormality identified. No mass. No hydrocephalus. Optic globes normal. No acute bony abnormality. 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  IMPRESSION: 1. No acute intracranial abnormality. 2. 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 3. 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   Electronically Signed   By: Maisie Fus  Register   On: 11/05/2013 19:41     EKG Interpretation None      MDM   Final diagnoses:  Alcohol intoxication, uncomplicated  Polysubstance abuse   Patient is a 53 y.o. Male who presents to the ED with intoxication.  Patient has an alcohol level of greater than 498.  CBC shows macrocytic anemia.  CMP reveals hypokalemia and elevated LFTs.  UDS shows THC positive and Cocaine positive.  Given altered mental status head CT performed and is negative.  CXR is negative and was performed given mild hypoxia.  Tylenol and salicylates are negative.  Patient  is able to walk with a steady gait at this time.  Patient tolerated PO here.  Patient is stable for discharge at this time.  Patient was discussed with Dr. Rubin Payor who states understanding and agreement to the above plan.        Eben Burow, PA-C 11/05/13 2247

## 2013-11-05 NOTE — ED Provider Notes (Signed)
Medical screening examination/treatment/procedure(s) were conducted as a shared visit with non-physician practitioner(s) and myself.  I personally evaluated the patient during the encounter.   EKG Interpretation None     Patient with intoxication. Severely elevated alcohol. History same. Head CT reassuring. Mental status improved. Will discharge home  Juliet Rude. Rubin Payor, MD 11/05/13 2358

## 2013-11-05 NOTE — ED Notes (Signed)
Pt able to ambulate without assistance or assistive device but still staggering, unable to walk straight down the hall; pt states, "a little woozy".

## 2013-11-18 ENCOUNTER — Emergency Department (HOSPITAL_COMMUNITY)
Admission: EM | Admit: 2013-11-18 | Discharge: 2013-11-19 | Disposition: A | Discharge status: Home / Self Care | Payer: PRIVATE HEALTH INSURANCE | Attending: Emergency Medicine | Admitting: Emergency Medicine

## 2013-11-18 ENCOUNTER — Emergency Department (HOSPITAL_COMMUNITY): Payer: PRIVATE HEALTH INSURANCE

## 2013-11-18 ENCOUNTER — Encounter (HOSPITAL_COMMUNITY): Payer: Self-pay | Admitting: Emergency Medicine

## 2013-11-18 DIAGNOSIS — S0010XA Contusion of unspecified eyelid and periocular area, initial encounter: Secondary | ICD-10-CM

## 2013-11-18 DIAGNOSIS — Z8659 Personal history of other mental and behavioral disorders: Secondary | ICD-10-CM | POA: Insufficient documentation

## 2013-11-18 DIAGNOSIS — Y9389 Activity, other specified: Secondary | ICD-10-CM | POA: Insufficient documentation

## 2013-11-18 DIAGNOSIS — I10 Essential (primary) hypertension: Secondary | ICD-10-CM | POA: Diagnosis not present

## 2013-11-18 DIAGNOSIS — S0990XA Unspecified injury of head, initial encounter: Secondary | ICD-10-CM | POA: Insufficient documentation

## 2013-11-18 DIAGNOSIS — F1010 Alcohol abuse, uncomplicated: Secondary | ICD-10-CM | POA: Insufficient documentation

## 2013-11-18 DIAGNOSIS — R296 Repeated falls: Secondary | ICD-10-CM | POA: Diagnosis not present

## 2013-11-18 DIAGNOSIS — IMO0002 Reserved for concepts with insufficient information to code with codable children: Secondary | ICD-10-CM

## 2013-11-18 DIAGNOSIS — Y9289 Other specified places as the place of occurrence of the external cause: Secondary | ICD-10-CM | POA: Diagnosis not present

## 2013-11-18 DIAGNOSIS — S0993XA Unspecified injury of face, initial encounter: Secondary | ICD-10-CM | POA: Insufficient documentation

## 2013-11-18 DIAGNOSIS — F17200 Nicotine dependence, unspecified, uncomplicated: Secondary | ICD-10-CM | POA: Diagnosis not present

## 2013-11-18 DIAGNOSIS — Z8739 Personal history of other diseases of the musculoskeletal system and connective tissue: Secondary | ICD-10-CM | POA: Insufficient documentation

## 2013-11-18 DIAGNOSIS — R012 Other cardiac sounds: Secondary | ICD-10-CM | POA: Insufficient documentation

## 2013-11-18 DIAGNOSIS — S199XXA Unspecified injury of neck, initial encounter: Secondary | ICD-10-CM

## 2013-11-18 DIAGNOSIS — F10920 Alcohol use, unspecified with intoxication, uncomplicated: Secondary | ICD-10-CM

## 2013-11-18 LAB — CBC WITH DIFFERENTIAL/PLATELET
Basophils Absolute: 0.1 10*3/uL (ref 0.0–0.1)
Basophils Relative: 3 % — ABNORMAL HIGH (ref 0–1)
Eosinophils Absolute: 0 10*3/uL (ref 0.0–0.7)
Eosinophils Relative: 1 % (ref 0–5)
HCT: 43.7 % (ref 39.0–52.0)
HEMOGLOBIN: 15.4 g/dL (ref 13.0–17.0)
LYMPHS PCT: 29 % (ref 12–46)
Lymphs Abs: 1.2 10*3/uL (ref 0.7–4.0)
MCH: 35.1 pg — AB (ref 26.0–34.0)
MCHC: 35.2 g/dL (ref 30.0–36.0)
MCV: 99.5 fL (ref 78.0–100.0)
Monocytes Absolute: 0.4 10*3/uL (ref 0.1–1.0)
Monocytes Relative: 10 % (ref 3–12)
NEUTROS PCT: 57 % (ref 43–77)
Neutro Abs: 2.4 10*3/uL (ref 1.7–7.7)
PLATELETS: 113 10*3/uL — AB (ref 150–400)
RBC: 4.39 MIL/uL (ref 4.22–5.81)
RDW: 14.9 % (ref 11.5–15.5)
WBC: 4.1 10*3/uL (ref 4.0–10.5)

## 2013-11-18 LAB — COMPREHENSIVE METABOLIC PANEL
ALBUMIN: 3.4 g/dL — AB (ref 3.5–5.2)
ALT: 73 U/L — ABNORMAL HIGH (ref 0–53)
AST: 176 U/L — AB (ref 0–37)
Alkaline Phosphatase: 157 U/L — ABNORMAL HIGH (ref 39–117)
Anion gap: 14 (ref 5–15)
BUN: 3 mg/dL — AB (ref 6–23)
CO2: 27 mEq/L (ref 19–32)
CREATININE: 0.53 mg/dL (ref 0.50–1.35)
Calcium: 8.5 mg/dL (ref 8.4–10.5)
Chloride: 105 mEq/L (ref 96–112)
GFR calc Af Amer: >90 mL/min (ref >90)
GFR calc non Af Amer: >90 mL/min (ref >90)
Glucose, Bld: 88 mg/dL (ref 70–99)
Potassium: 3.9 mEq/L (ref 3.7–5.3)
Sodium: 146 mEq/L (ref 137–147)
TOTAL PROTEIN: 7.5 g/dL (ref 6.0–8.3)
Total Bilirubin: 0.4 mg/dL (ref 0.3–1.2)

## 2013-11-18 LAB — PROTIME-INR
INR: 0.85 (ref 0.00–1.49)
PROTHROMBIN TIME: 11.6 s (ref 11.6–15.2)

## 2013-11-18 LAB — ETHANOL: ALCOHOL ETHYL (B): 480 mg/dL — AB (ref 0–11)

## 2013-11-18 MED ORDER — THIAMINE HCL 100 MG/ML IJ SOLN
100.0000 mg | Freq: Once | INTRAMUSCULAR | Status: AC
  Administered 2013-11-18: 100 mg via INTRAVENOUS
  Filled 2013-11-18: qty 2

## 2013-11-18 NOTE — ED Notes (Addendum)
Pt sleeping/resting, NAD, calm, interactive, requesting drink, soda given, declined food, urinal used. Waiting for sobriety.

## 2013-11-18 NOTE — ED Notes (Signed)
Pt arrived via [facility] on [facility]SB and c-collar intact; pt placed on monitor, continuous pulse oximetry and blood pressure cuff

## 2013-11-18 NOTE — ED Notes (Signed)
Per EMS pt found outside intoxicated, bystanders witnessing multiple falls with loss of consciousness. Pt lethargic and hypothermic to touch upon ems arrival, pt became more alert enroute. Pt. Has bruising to bilateral eyes with open abrasion to left cheek and laceration to bridge of nose. Pt sts bruising to bilateral eyes is old. Pt repeating questioning about c-collar for EMS.

## 2013-11-18 NOTE — ED Notes (Signed)
Pt back from CT. c-collar remains. Lab finished at [facility].  Lethargic/ intoxicated. NAD, calm, interactive, appropriate. Speech slurred/ sluggish.  2nd IV established. Blood sent. C/o bilateral face pain.

## 2013-11-18 NOTE — ED Notes (Addendum)
Pt sleeping, no changes.  CBIR, fall risk bands on, shoes on, yellow socks at door and on bed rails. Pt sleeping

## 2013-11-18 NOTE — ED Notes (Addendum)
Dr. Fayrene Fearing into room. c-collar removed. No changes with pt, lethargic/ intoxicated, NAD, calm, interactive. VSS. D/c plan verbalized: "waiting for sobriety".

## 2013-11-19 DIAGNOSIS — S0010XA Contusion of unspecified eyelid and periocular area, initial encounter: Secondary | ICD-10-CM | POA: Diagnosis not present

## 2013-11-19 MED ORDER — LORAZEPAM 1 MG PO TABS
2.0000 mg | ORAL_TABLET | Freq: Once | ORAL | Status: AC
  Administered 2013-11-19: 2 mg via ORAL
  Filled 2013-11-19: qty 2

## 2013-11-19 MED ORDER — CLINDAMYCIN HCL 150 MG PO CAPS: 150.0000 mg | ORAL_CAPSULE | Freq: Four times a day (QID) | ORAL | Status: DC

## 2013-11-19 MED ORDER — POLYMYXIN B-TRIMETHOPRIM 10000-0.1 UNIT/ML-% OP SOLN: 1.0000 [drp] | OPHTHALMIC | Status: DC

## 2013-11-19 NOTE — ED Provider Notes (Signed)
At discharge I have reassessed the patient. I have reviewed his CT report and finally there were no acute injuries associated with the fall. This is actually a second injury for the patient secondary to alcohol abuse/intoxication and falls. He does have extensive facial bruising and swelling around the periorbital area. He also has some purulent discharge in the left eye. The extraocular motions are intact, I do not see any hyphema, there is a mild to moderate subconjunctival hemorrhage. The surrounding tissues are ecchymotic and swollen with mild superficial abrasion. Due to the conjunctivitis the patient will be started on Polytrim ophthalmic drops and also at oral clindamycin to cover for the potential of soft tissue infection with abrasions and swelling surrounding the periorbital area.  The patient is awake alert and oriented. His speech is cognitively intact with normal insight at this point time. He will be given 2 mg of Ativan by mouth prior to discharge. He has heavy chronic alcohol use and at this point in time does not voice plan to discontinue use. Patient is encouraged to seek counseling as he is experiencing significant risk to his life and health.  [name], MD 11/19/13 [phone]

## 2013-11-19 NOTE — ED Notes (Signed)
Asked Dr. Donnald Garre to re-evaluate pt. Before d/c. Still having shakes, did ambulate to bathroom - steady gait. During Dr. Fabian Sharp eval. Pt. Did not have the shakes. States, "pt. Will have shake for a short period of time when not drinking." Dr. Josem Kaufmann. Stated, "pt. Can go home."

## 2013-11-19 NOTE — ED Provider Notes (Signed)
CSN: [id number]     Arrival date & time 11/18/13  1826 History   First MD Initiated Contact with Patient 11/18/13 1829     Chief Complaint  Patient presents with  . Fall  . Alcohol Intoxication      HPI  Patient presents after being seen outside and falling into a pile of mulch on the sidewalk outside the hospital. I stated her concern. He was brought in by EMS. States is been drinking today. States he drinks "most days". Placed in a c-collar and spine board by paramedics. Noted a bruise around his eyes and abrasion to his face.  Past Medical History  Diagnosis Date  . Hypertension   . Degenerative disc disease   . Depression    Past Surgical History  Procedure Laterality Date  . Cervical spine surgery     No family history on file. History  Substance Use Topics  . Smoking status: Current Some Day Smoker  . Smokeless tobacco: Not on file  . Alcohol Use: Yes    Review of Systems  Constitutional: Negative for fever, chills, diaphoresis, appetite change and fatigue.  HENT: Negative for mouth sores, sore throat and trouble swallowing.   Eyes: Negative for visual disturbance.  Respiratory: Negative for cough, chest tightness, shortness of breath and wheezing.   Cardiovascular: Negative for chest pain.  Gastrointestinal: Negative for nausea, vomiting, abdominal pain, diarrhea and abdominal distention.  Endocrine: Negative for polydipsia, polyphagia and polyuria.  Genitourinary: Negative for dysuria, frequency and hematuria.  Musculoskeletal: Negative for gait problem.  Skin: Negative for color change, pallor and rash.  Neurological: Negative for dizziness, syncope, light-headedness and headaches.       Planes of recent "difficulty with coordination". Upon further history it seems this is mostly on days when he is intoxicated.  Hematological: Does not bruise/bleed easily.  Psychiatric/Behavioral: Negative for behavioral problems and confusion.      Allergies  Lisinopril;  Other; and Sulfonamide derivatives  Home Medications   Prior to Admission medications   Not on File   BP 154/96  Pulse 71  Temp(Src) 97.7 F (36.5 C) (Oral)  Resp 18  SpO2 94% Physical Exam  Constitutional: He is oriented to person, place, and time. He appears well-developed and well-nourished. No distress. Cervical collar and backboard in place.  HENT:  Head: Normocephalic.    Eyes: Conjunctivae are normal. Pupils are equal, round, and reactive to light. No scleral icterus.  Reports normal vision. Pupils equal and reactive at 3 mm.  Neck: Normal range of motion. Neck supple. No thyromegaly present.  A cervical collar. He denies midline neck pain.  Cardiovascular: Normal rate and regular rhythm.  Exam reveals distant heart sounds. Exam reveals no gallop and no friction rub.   No murmur heard. Pulmonary/Chest: Effort normal and breath sounds normal. No respiratory distress. He has no wheezes. He has no rales.  Abdominal: Soft. Bowel sounds are normal. He exhibits no distension. There is no tenderness. There is no rebound.  Musculoskeletal: Normal range of motion.  Neurological: He is alert and oriented to person, place, and time. GCS eye subscore is 4. GCS verbal subscore is 5. GCS motor subscore is 6.  No cranial nerve deficits. Normal strength and sensation to his peripheral neurological exam.  Skin: Skin is warm and dry. No rash noted.  Psychiatric: He has a normal mood and affect. His behavior is normal.    ED Course  Procedures (including critical care time) Labs Review Labs Reviewed  CBC WITH DIFFERENTIAL -  Abnormal; Notable for the following:    MCH 35.1 (*)    Platelets 113 (*)    Basophils Relative 3 (*)    All other components within normal limits  COMPREHENSIVE METABOLIC PANEL - Abnormal; Notable for the following:    BUN 3 (*)    Albumin 3.4 (*)    AST 176 (*)    ALT 73 (*)    Alkaline Phosphatase 157 (*)    All other components within normal limits    ETHANOL - Abnormal; Notable for the following:    Alcohol, Ethyl (B) 480 (*)    All other components within normal limits  PROTIME-INR    Imaging Review Ct Head Wo Contrast  11/18/2013   CLINICAL DATA:  Headache, vertigo, repeated falls  EXAM: CT HEAD WITHOUT CONTRAST  CT MAXILLOFACIAL WITHOUT CONTRAST  CT CERVICAL SPINE WITHOUT CONTRAST  TECHNIQUE: Multidetector CT imaging of the head, cervical spine, and maxillofacial structures were performed using the standard protocol without intravenous contrast. Multiplanar CT image reconstructions of the cervical spine and maxillofacial structures were also generated.  COMPARISON:  CT head dated 11/05/2013  FINDINGS: CT HEAD FINDINGS  No evidence of parenchymal hemorrhage or extra-axial fluid collection. No mass lesion, mass effect, or midline shift.  No CT evidence of acute infarction.  Subcortical white matter and periventricular small vessel ischemic changes.  Cerebral volume is within normal limits.  No ventriculomegaly.  The visualized paranasal sinuses are essentially clear. The mastoid air cells are unopacified.  10 x 13 mm extracranial hematoma overlying the left frontal bone (series 201/image 16).  No evidence of calvarial fracture.  CT MAXILLOFACIAL FINDINGS  No evidence of maxillofacial fracture.  The visualized paranasal sinuses are essentially clear. The mastoid air cells are unopacified.  The bilateral mandibular condyles are intact and well-seated in the TMJs.  Soft tissue swelling overlying the left lateral orbit/ zygoma (series 301/image 62). Underlying orbits, including the globes and retroconal soft tissues, are within normal limits.  CT CERVICAL SPINE FINDINGS  Normal cervical lordosis.  Status post ACDF from C3-7.  Mild degenerative changes at C2-3 and C7-T1.  No evidence of fracture dislocation. Few body heights are maintained. Dens appears intact.  No prevertebral soft tissue swelling.  Visualized thyroid is unremarkable.  Visualized lung  apices are clear.  IMPRESSION: Small extracranial hematoma overlying the left frontal bone. No evidence of calvarial fracture. No evidence of acute intracranial abnormality.  Soft tissue swelling overlying the left lateral orbit/zygoma. Underlying globe and retrocrural soft tissues are within normal limits. No evidence of maxillofacial fracture.  Status post C3-7 ACDF. No evidence of traumatic injury to the cervical spine. Mild degenerative changes.   Electronically Signed   By: Charline Bills M.D.   On: 11/18/2013 19:30   Ct Cervical Spine Wo Contrast  11/18/2013   CLINICAL DATA:  Headache, vertigo, repeated falls  EXAM: CT HEAD WITHOUT CONTRAST  CT MAXILLOFACIAL WITHOUT CONTRAST  CT CERVICAL SPINE WITHOUT CONTRAST  TECHNIQUE: Multidetector CT imaging of the head, cervical spine, and maxillofacial structures were performed using the standard protocol without intravenous contrast. Multiplanar CT image reconstructions of the cervical spine and maxillofacial structures were also generated.  COMPARISON:  CT head dated 11/05/2013  FINDINGS: CT HEAD FINDINGS  No evidence of parenchymal hemorrhage or extra-axial fluid collection. No mass lesion, mass effect, or midline shift.  No CT evidence of acute infarction.  Subcortical white matter and periventricular small vessel ischemic changes.  Cerebral volume is within normal limits.  No ventriculomegaly.  The visualized paranasal sinuses are essentially clear. The mastoid air cells are unopacified.  10 x 13 mm extracranial hematoma overlying the left frontal bone (series 201/image 16).  No evidence of calvarial fracture.  CT MAXILLOFACIAL FINDINGS  No evidence of maxillofacial fracture.  The visualized paranasal sinuses are essentially clear. The mastoid air cells are unopacified.  The bilateral mandibular condyles are intact and well-seated in the TMJs.  Soft tissue swelling overlying the left lateral orbit/ zygoma (series 301/image 62). Underlying orbits, including  the globes and retroconal soft tissues, are within normal limits.  CT CERVICAL SPINE FINDINGS  Normal cervical lordosis.  Status post ACDF from C3-7.  Mild degenerative changes at C2-3 and C7-T1.  No evidence of fracture dislocation. Few body heights are maintained. Dens appears intact.  No prevertebral soft tissue swelling.  Visualized thyroid is unremarkable.  Visualized lung apices are clear.  IMPRESSION: Small extracranial hematoma overlying the left frontal bone. No evidence of calvarial fracture. No evidence of acute intracranial abnormality.  Soft tissue swelling overlying the left lateral orbit/zygoma. Underlying globe and retrocrural soft tissues are within normal limits. No evidence of maxillofacial fracture.  Status post C3-7 ACDF. No evidence of traumatic injury to the cervical spine. Mild degenerative changes.   Electronically Signed   By: Charline Bills M.D.   On: 11/18/2013 19:30   Ct Maxillofacial Wo Cm  11/18/2013   CLINICAL DATA:  Headache, vertigo, repeated falls  EXAM: CT HEAD WITHOUT CONTRAST  CT MAXILLOFACIAL WITHOUT CONTRAST  CT CERVICAL SPINE WITHOUT CONTRAST  TECHNIQUE: Multidetector CT imaging of the head, cervical spine, and maxillofacial structures were performed using the standard protocol without intravenous contrast. Multiplanar CT image reconstructions of the cervical spine and maxillofacial structures were also generated.  COMPARISON:  CT head dated 11/05/2013  FINDINGS: CT HEAD FINDINGS  No evidence of parenchymal hemorrhage or extra-axial fluid collection. No mass lesion, mass effect, or midline shift.  No CT evidence of acute infarction.  Subcortical white matter and periventricular small vessel ischemic changes.  Cerebral volume is within normal limits.  No ventriculomegaly.  The visualized paranasal sinuses are essentially clear. The mastoid air cells are unopacified.  10 x 13 mm extracranial hematoma overlying the left frontal bone (series 201/image 16).  No evidence of  calvarial fracture.  CT MAXILLOFACIAL FINDINGS  No evidence of maxillofacial fracture.  The visualized paranasal sinuses are essentially clear. The mastoid air cells are unopacified.  The bilateral mandibular condyles are intact and well-seated in the TMJs.  Soft tissue swelling overlying the left lateral orbit/ zygoma (series 301/image 62). Underlying orbits, including the globes and retroconal soft tissues, are within normal limits.  CT CERVICAL SPINE FINDINGS  Normal cervical lordosis.  Status post ACDF from C3-7.  Mild degenerative changes at C2-3 and C7-T1.  No evidence of fracture dislocation. Few body heights are maintained. Dens appears intact.  No prevertebral soft tissue swelling.  Visualized thyroid is unremarkable.  Visualized lung apices are clear.  IMPRESSION: Small extracranial hematoma overlying the left frontal bone. No evidence of calvarial fracture. No evidence of acute intracranial abnormality.  Soft tissue swelling overlying the left lateral orbit/zygoma. Underlying globe and retrocrural soft tissues are within normal limits. No evidence of maxillofacial fracture.  Status post C3-7 ACDF. No evidence of traumatic injury to the cervical spine. Mild degenerative changes.   Electronically Signed   By: Charline Bills M.D.   On: 11/18/2013 19:30     EKG Interpretation None      MDM  Final diagnoses:  Intoxication  Periorbital ecchymosis, unspecified laterality, initial encounter  Alcohol intoxication, uncomplicated    Pt with CT scan showing no acute abnormalities. Has had recent facial fractures. Has some soft tissue swelling extracranially. Clinically has periorbital ecchymosis. Reports normal vision. Despite his high alcohol of 480 she is lucid oriented calm and interactive. I will observe him to metabolize his alcohol. Will ambulate him to make sure he is stable in his feet. I think his report of "balance problems" is likely due to his intoxication.    Rolland Porter,  MD 11/19/13 [id number]

## 2013-11-19 NOTE — Discharge Instructions (Signed)
Alcohol Intoxication Alcohol intoxication occurs when the amount of alcohol that a person has consumed impairs his or her ability to mentally and physically function. Alcohol directly impairs the normal chemical activity of the brain. Drinking large amounts of alcohol can lead to changes in mental function and behavior, and it can cause many physical effects that can be harmful.  Alcohol intoxication can range in severity from mild to very severe. Various factors can affect the level of intoxication that occurs, such as the person's age, gender, weight, frequency of alcohol consumption, and the presence of other medical conditions (such as diabetes, seizures, or heart conditions). Dangerous levels of alcohol intoxication may occur when people drink large amounts of alcohol in a short period (binge drinking). Alcohol can also be especially dangerous when combined with certain prescription medicines or "recreational" drugs. SIGNS AND SYMPTOMS Some common signs and symptoms of mild alcohol intoxication include:  Loss of coordination.  Changes in mood and behavior.  Impaired judgment.  Slurred speech. As alcohol intoxication progresses to more severe levels, other signs and symptoms will appear. These may include:  Vomiting.  Confusion and impaired memory.  Slowed breathing.  Seizures.  Loss of consciousness. DIAGNOSIS  Your health care provider will take a medical history and perform a physical exam. You will be asked about the amount and type of alcohol you have consumed. Blood tests will be done to measure the concentration of alcohol in your blood. In many places, your blood alcohol level must be lower than 80 mg/dL (1.61%) to legally drive. However, many dangerous effects of alcohol can occur at much lower levels.  TREATMENT  People with alcohol intoxication often do not require treatment. Most of the effects of alcohol intoxication are temporary, and they go away as the alcohol naturally  leaves the body. Your health care provider will monitor your condition until you are stable enough to go home. Fluids are sometimes given through an IV access tube to help prevent dehydration.  HOME CARE INSTRUCTIONS  Do not drive after drinking alcohol.  Stay hydrated. Drink enough water and fluids to keep your urine clear or pale yellow. Avoid caffeine.   Only take over-the-counter or prescription medicines as directed by your health care provider.  SEEK MEDICAL CARE IF:   You have persistent vomiting.   You do not feel better after a few days.  You have frequent alcohol intoxication. Your health care provider can help determine if you should see a substance use treatment counselor. SEEK IMMEDIATE MEDICAL CARE IF:   You become shaky or tremble when you try to stop drinking.   You shake uncontrollably (seizure).   You throw up (vomit) blood. This may be bright red or may look like black coffee grounds.   You have blood in your stool. This may be bright red or may appear as a black, tarry, bad smelling stool.   You become lightheaded or faint.  MAKE SURE YOU:   Understand these instructions.  Will watch your condition.  Will get help right away if you are not doing well or get worse. Document Released: 11/24/2004 Document Revised: 10/17/2012 Document Reviewed: 07/20/2012 [facility] Patient Information 2015 [place], [place]. This information is not intended to replace advice given to you by your health care provider. Make sure you discuss any questions you have with your health care provider. Conjunctivitis Conjunctivitis is commonly called "pink eye." Conjunctivitis can be caused by bacterial or viral infection, allergies, or injuries. There is usually redness of the lining of  the eye, itching, discomfort, and sometimes discharge. There may be deposits of matter along the eyelids. A viral infection usually causes a watery discharge, while a bacterial infection causes a  yellowish, thick discharge. Pink eye is very contagious and spreads by direct contact. You may be given antibiotic eyedrops as part of your treatment. Before using your eye medicine, remove all drainage from the eye by washing gently with warm water and cotton balls. Continue to use the medication until you have awakened 2 mornings in a row without discharge from the eye. Do not rub your eye. This increases the irritation and helps spread infection. Use separate towels from other household members. Wash your hands with soap and water before and after touching your eyes. Use cold compresses to reduce pain and sunglasses to relieve irritation from light. Do not wear contact lenses or wear eye makeup until the infection is gone. SEEK MEDICAL CARE IF:   Your symptoms are not better after 3 days of treatment.  You have increased pain or trouble seeing.  The outer eyelids become very red or swollen. Document Released: 03/24/2004 Document Revised: 05/09/2011 Document Reviewed: 02/14/2005 [facility] Patient Information 2015 [place], [place]. This information is not intended to replace advice given to you by your health care provider. Make sure you discuss any questions you have with your health care provider.

## 2013-11-19 NOTE — ED Notes (Signed)
Resting sleeping, no changes, NAD, calm, urinal and CBIR.

## 2014-01-05 ENCOUNTER — Encounter (HOSPITAL_COMMUNITY): Payer: Self-pay | Admitting: Emergency Medicine

## 2014-01-05 ENCOUNTER — Emergency Department (HOSPITAL_COMMUNITY)
Admission: EM | Admit: 2014-01-05 | Discharge: 2014-01-06 | Disposition: A | Discharge status: Home / Self Care | Payer: PRIVATE HEALTH INSURANCE | Attending: Emergency Medicine | Admitting: Emergency Medicine

## 2014-01-05 DIAGNOSIS — F1210 Cannabis abuse, uncomplicated: Secondary | ICD-10-CM | POA: Diagnosis not present

## 2014-01-05 DIAGNOSIS — Z8659 Personal history of other mental and behavioral disorders: Secondary | ICD-10-CM | POA: Diagnosis not present

## 2014-01-05 DIAGNOSIS — Z8739 Personal history of other diseases of the musculoskeletal system and connective tissue: Secondary | ICD-10-CM | POA: Insufficient documentation

## 2014-01-05 DIAGNOSIS — Z720 Tobacco use: Secondary | ICD-10-CM | POA: Diagnosis not present

## 2014-01-05 DIAGNOSIS — F10120 Alcohol abuse with intoxication, uncomplicated: Secondary | ICD-10-CM | POA: Diagnosis not present

## 2014-01-05 DIAGNOSIS — F10920 Alcohol use, unspecified with intoxication, uncomplicated: Secondary | ICD-10-CM

## 2014-01-05 DIAGNOSIS — I10 Essential (primary) hypertension: Secondary | ICD-10-CM | POA: Diagnosis not present

## 2014-01-05 LAB — RAPID URINE DRUG SCREEN, HOSP PERFORMED
Amphetamines: NOT DETECTED
BARBITURATES: NOT DETECTED
Benzodiazepines: NOT DETECTED
Cocaine: NOT DETECTED
OPIATES: NOT DETECTED
Tetrahydrocannabinol: POSITIVE — AB

## 2014-01-05 LAB — CBC
HEMATOCRIT: 40.5 % (ref 39.0–52.0)
Hemoglobin: 13.8 g/dL (ref 13.0–17.0)
MCH: 34.5 pg — ABNORMAL HIGH (ref 26.0–34.0)
MCHC: 34.1 g/dL (ref 30.0–36.0)
MCV: 101.3 fL — AB (ref 78.0–100.0)
PLATELETS: 197 10*3/uL (ref 150–400)
RBC: 4 MIL/uL — ABNORMAL LOW (ref 4.22–5.81)
RDW: 13.8 % (ref 11.5–15.5)
WBC: 4.4 10*3/uL (ref 4.0–10.5)

## 2014-01-05 LAB — ACETAMINOPHEN LEVEL: Acetaminophen (Tylenol), Serum: <15 ug/mL (ref 10–30)

## 2014-01-05 LAB — COMPREHENSIVE METABOLIC PANEL
ALBUMIN: 2.6 g/dL — AB (ref 3.5–5.2)
ALT: 71 U/L — ABNORMAL HIGH (ref 0–53)
ANION GAP: 15 (ref 5–15)
AST: 166 U/L — ABNORMAL HIGH (ref 0–37)
Alkaline Phosphatase: 224 U/L — ABNORMAL HIGH (ref 39–117)
BILIRUBIN TOTAL: 0.3 mg/dL (ref 0.3–1.2)
BUN: 4 mg/dL — AB (ref 6–23)
CO2: 28 mEq/L (ref 19–32)
CREATININE: 0.46 mg/dL — AB (ref 0.50–1.35)
Calcium: 7.7 mg/dL — ABNORMAL LOW (ref 8.4–10.5)
Chloride: 105 mEq/L (ref 96–112)
GFR calc Af Amer: >90 mL/min (ref >90)
GFR calc non Af Amer: >90 mL/min (ref >90)
Glucose, Bld: 86 mg/dL (ref 70–99)
Potassium: 3.4 mEq/L — ABNORMAL LOW (ref 3.7–5.3)
Sodium: 148 mEq/L — ABNORMAL HIGH (ref 137–147)
Total Protein: 6.7 g/dL (ref 6.0–8.3)

## 2014-01-05 LAB — SALICYLATE LEVEL: Salicylate Lvl: <2 mg/dL — ABNORMAL LOW (ref 2.8–20.0)

## 2014-01-05 LAB — ETHANOL: Alcohol, Ethyl (B): 344 mg/dL — ABNORMAL HIGH (ref 0–11)

## 2014-01-05 MED ORDER — VITAMIN B-1 100 MG PO TABS
100.0000 mg | ORAL_TABLET | Freq: Every day | ORAL | Status: DC
  Administered 2014-01-05 – 2014-01-06 (×2): 100 mg via ORAL
  Filled 2014-01-05 (×2): qty 1

## 2014-01-05 MED ORDER — LORAZEPAM 1 MG PO TABS
0.0000 mg | ORAL_TABLET | Freq: Two times a day (BID) | ORAL | Status: DC
Start: 2014-01-07 — End: 2014-01-06
  Filled 2014-01-05: qty 1

## 2014-01-05 MED ORDER — ONDANSETRON HCL 4 MG PO TABS
4.0000 mg | ORAL_TABLET | Freq: Three times a day (TID) | ORAL | Status: DC | PRN
  Administered 2014-01-05: 4 mg via ORAL
  Filled 2014-01-05: qty 1

## 2014-01-05 MED ORDER — ALUM & MAG HYDROXIDE-SIMETH 200-200-20 MG/5ML PO SUSP: 30.0000 mL | ORAL | Status: DC | PRN

## 2014-01-05 MED ORDER — NICOTINE 21 MG/24HR TD PT24
21.0000 mg | MEDICATED_PATCH | Freq: Every day | TRANSDERMAL | Status: DC
  Administered 2014-01-05 – 2014-01-06 (×2): 21 mg via TRANSDERMAL
  Filled 2014-01-05 (×2): qty 1

## 2014-01-05 MED ORDER — LORAZEPAM 1 MG PO TABS
0.0000 mg | ORAL_TABLET | Freq: Four times a day (QID) | ORAL | Status: DC
  Administered 2014-01-05: 2 mg via ORAL
  Administered 2014-01-05: 1 mg via ORAL
  Administered 2014-01-06: 2 mg via ORAL
  Administered 2014-01-06 (×2): 1 mg via ORAL
  Filled 2014-01-05: qty 2
  Filled 2014-01-05: qty 1
  Filled 2014-01-05: qty 2
  Filled 2014-01-05: qty 1

## 2014-01-05 MED ORDER — THIAMINE HCL 100 MG/ML IJ SOLN: 100.0000 mg | Freq: Every day | INTRAMUSCULAR | Status: DC

## 2014-01-05 NOTE — BH Assessment (Addendum)
Tele Assessment Note   [name] is an 53 y.o. male that was assessed this day via tele assessment per EDP Otter's orders.  Clinical information [facility] on [facility] @ [phone].  [facility] after EMS found pt intoxicated at a laundromat refusing to leave the premises.  Pt too intoxicated to walk, so EMS called per ED notes.  Pt reports he has been drinking varying amounts of "rot gut wine," Listerine and liquor.  Pt has been drinking daily and reports last drank last night - 2 bottles of wine and unknown amount of Listerine.  He also admits to using marijuana once weekly.  Pt stated he needs help with alcohol detox.  Pt stated current stressors are getting evicted from his apartment for associating with "the wrong people," financial stress, medical and having no support system.  Pt denies SI, HI or AVH and no delusions noted.  Pt reports current withdrawal sx are tremors and headache from alcohol.  He denies hx of seizures.  Pt calm, cooperative, oriented x 3, had good eye contact, soft and slurred speech, depressed mood and appropriate affect.  Pt has had both inpatient and outpatient treatment for SA and depression in the past by report.  [facility], but cannot recall previous dates of inpatient and outpatient providers he has had in the past.  Inpatient detox recommended by this clinician.  Consulted with Terressa KoyanagiJosephine O., NP @ [phone] who recommended inpatient detox.  Called EDP Harrison @ 0830 who was in agreement with pt disposition.  Updated ED and TTS staff.  TTS to seek placement for the pt.  Axis I: 303.90 Alcohol Use Disorder, Severe, 311 Unspecified Depressive Disorder Axis II: Deferred Axis III:  Past Medical History  Diagnosis Date  . Hypertension   . Degenerative disc disease   . Depression    Axis IV: economic problems, housing problems, other psychosocial or environmental problems, problems related to social environment and problems  with primary support group Axis V: 31-40 impairment in reality testing  Past Medical History:  Past Medical History  Diagnosis Date  . Hypertension   . Degenerative disc disease   . Depression     Past Surgical History  Procedure Laterality Date  . Cervical spine surgery      Family History: History reviewed. No pertinent family history.  Social History:  reports that he has been smoking.  He does not have any smokeless tobacco history on file. He reports that he drinks alcohol. He reports that he uses illicit drugs (Marijuana).  Additional Social History:  Alcohol / Drug Use Pain Medications: see med list Prescriptions: see med list Over the Counter: see med list History of alcohol / drug use?: Yes Longest period of sobriety (when/how long): unknown Negative Consequences of Use: Financial, Personal relationships Withdrawal Symptoms: Tremors, Patient aware of relationship between substance abuse and physical/medical complications Substance #1 Name of Substance 1: Alcohol 1 - Age of First Use: Teens 1 - Amount (size/oz): varies - 2 "rot gut wines," Listerine and liquor 1 - Frequency: daily 1 - Duration: ongoing 1 - Last Use / Amount: Last night - unsure of amount of wine and Listerine Substance #2 Name of Substance 2: Marijuana 2 - Age of First Use: Teens 2 - Amount (size/oz): varies 2 - Frequency: once per week 2 - Duration: ongoing 2 - Last Use / Amount: 1 week ago - unknown amount  CIWA: CIWA-Ar BP: 124/94  mmHg Pulse Rate: 84 Nausea and Vomiting: mild nausea with no vomiting Tactile Disturbances: none Tremor: two Auditory Disturbances: not present Paroxysmal Sweats: no sweat visible Visual Disturbances: not present Anxiety: no anxiety, at ease Headache, Fullness in Head: none present Agitation: normal activity Orientation and Clouding of Sensorium: cannot do serial additions or is uncertain about date CIWA-Ar Total: 4 COWS:    PATIENT STRENGTHS: (choose at  least two) Ability for insight Average or above average intelligence Capable of independent living Communication skills Motivation for treatment/growth  Allergies:  Allergies  Allergen Reactions  . Lisinopril Swelling    (Angioedema)  . Other     Mycin Drugs  . Sulfonamide Derivatives Other (See Comments)    Childhood allergy    Home Medications:  (Not in a hospital admission)  OB/GYN Status:  No LMP for male patient.  General Assessment Data [facility] Is this a Tele or Face-to-Face Assessment?: Tele Assessment Is this an Initial Assessment or a Re-assessment for this encounter?: Initial Assessment Living Arrangements: Other (Comment) (Homeless) Can pt return to current living arrangement?: Yes Admission Status: Voluntary Is patient capable of signing voluntary admission?: Yes Transfer from: Acute Hospital Referral Source: Other     [facility] Living Arrangements: Other (Comment) (Homeless) Name of Psychiatrist: none Name of Therapist: none  Education Status Is patient currently in school?: No  Risk to self with the past 6 months Suicidal Ideation: No Suicidal Intent: No Is patient at risk for suicide?: No Suicidal Plan?: No Access to Means: No What has been your use of drugs/alcohol within the last 12 months?: pt reports daily use of alcohol and occ use of marijuana Previous Attempts/Gestures: No How many times?: 0 Other Self Harm Risks: na - pt denies Triggers for Past Attempts: None known Intentional Self Injurious Behavior: None Family Suicide History: No Recent stressful life event(s): Loss (Comment), Other (Comment) (SA, Evicted from apartment, Depression, Surveyor, quantityinancial) Persecutory voices/beliefs?: No Depression: Yes Depression Symptoms: Despondent, Insomnia, Fatigue, Loss of interest in usual pleasures, Feeling worthless/self pity Substance abuse history and/or treatment for substance abuse?: Yes Suicide prevention  information given to non-admitted patients: Not applicable  Risk to Others within the past 6 months Homicidal Ideation: No Thoughts of Harm to Others: No Current Homicidal Intent: No Current Homicidal Plan: No Access to Homicidal Means: No Identified Victim: na - pt denies History of harm to others?: No Assessment of Violence: None Noted Violent Behavior Description: na - pt calm, cooperative Does patient have access to weapons?: No Criminal Charges Pending?: No Does patient have a court date: No  Psychosis Hallucinations: None noted Delusions: None noted  Mental Status Report Appear/Hygiene: Disheveled Eye Contact: Good Motor Activity: Freedom of movement Speech: Logical/coherent, Soft, Slurred Level of Consciousness: Quiet/awake Mood: Depressed Affect: Appropriate to circumstance Anxiety Level: Minimal Thought Processes: Coherent, Relevant Judgement: Impaired Orientation: Person, Place, Situation Obsessive Compulsive Thoughts/Behaviors: None  Cognitive Functioning Concentration: Decreased Memory: Recent Impaired, Remote Impaired IQ: Average Insight: Poor Impulse Control: Poor Appetite: Poor Weight Loss:  (reports weighed 215 and now 148) Weight Gain: 0 Sleep: Decreased Total Hours of Sleep:  (Reports he doesn't sleep) Vegetative Symptoms: Decreased grooming  ADLScreening [facility] Patient's cognitive ability adequate to safely complete daily activities?: Yes Patient able to express need for assistance with ADLs?: Yes Independently performs ADLs?: Yes (appropriate for developmental age)  Prior Inpatient Therapy Prior Inpatient Therapy: Yes Prior Therapy Dates: 07/2013 [facility] and unknown previous dates/providers [facility]  other [facility] Reason for Treatment: SA  Prior Outpatient Therapy Prior Outpatient Therapy: Yes Prior Therapy Dates: Unknown dates Prior Therapy Facilty/Provider(s):  Unknown Reason for Treatment: Med mgnt  ADL Screening (condition at time of admission) Patient's cognitive ability adequate to safely complete daily activities?: Yes Is the patient deaf or have difficulty hearing?: No Does the patient have difficulty seeing, even when wearing glasses/contacts?: No Does the patient have difficulty concentrating, remembering, or making decisions?: No Patient able to express need for assistance with ADLs?: Yes Does the patient have difficulty dressing or bathing?: No Independently performs ADLs?: Yes (appropriate for developmental age) Does the patient have difficulty walking or climbing stairs?: No  Home Assistive Devices/Equipment Home Assistive Devices/Equipment: None    Abuse/Neglect Assessment (Assessment to be complete while patient is alone) Physical Abuse: Denies Verbal Abuse: Denies Sexual Abuse: Denies Exploitation of patient/patient's resources: Denies Self-Neglect: Denies Values / Beliefs Cultural Requests During Hospitalization: None Spiritual Requests During Hospitalization: None Consults Spiritual Care Consult Needed: No Social Work Consult Needed: No Merchant navy officer (For Healthcare) Does patient have an advance directive?: No Would patient like information on creating an advanced directive?: No - patient declined information    Additional Information 1:1 In Past 12 Months?: No CIRT Risk: No Elopement Risk: No Does patient have medical clearance?: Yes     Disposition:  Disposition Initial Assessment Completed for this Encounter: Yes Disposition of Patient: Referred to, Inpatient treatment program Type of inpatient treatment program: Adult  Casimer Lanius, MS, [facility] Licensed Professional Counselor Therapeutic Triage Specialist [facility] Phone: [phone] Fax: [phone]  01/05/2014 8:49 AM

## 2014-01-05 NOTE — ED Notes (Signed)
Per EMS, pt was at a laundromat, and [employer] was called because the pt was intoxicated and would not leave the premises. Pt was too intoxicated to walk, so [employer] called EMS. Pt has been drinking listerine. CBG 168.

## 2014-01-05 NOTE — ED Notes (Signed)
Meal tray delivered to bedside

## 2014-01-05 NOTE — ED Notes (Signed)
Pt able to walk while someone is holding on to him, however, when you let go of him, he starts to sway and lose his balance. Pt's hands have also started shaking. Norlene Campbelltter, MD is aware.

## 2014-01-05 NOTE — ED Notes (Signed)
Pt consulting with counselor via telepsych at this time.

## 2014-01-05 NOTE — ED Notes (Signed)
Patient placed into wine colored scrubs, wanded by security, and personal belongings placed in holding area. Pt very cooperative with procedures.

## 2014-01-05 NOTE — ED Notes (Signed)
Pt attempted standing up to urinate. Pt was able to stand, however he urinated on the floor. Pt back in bed and resting at this time.

## 2014-01-05 NOTE — ED Provider Notes (Signed)
12:51 PM Mild hypernatremia and mild elev in LFT's. Pt otherwise well appearing and medically cleared.   Purvis SheffieldForrest Maly Lemarr, MD 01/05/14 1555

## 2014-01-05 NOTE — Discharge Instructions (Signed)
Alcohol Intoxication °Alcohol intoxication occurs when the amount of alcohol that a person has consumed impairs his or her ability to mentally and physically function. Alcohol directly impairs the normal chemical activity of the brain. Drinking large amounts of alcohol can lead to changes in mental function and behavior, and it can cause many physical effects that can be harmful.  °Alcohol intoxication can range in severity from mild to very severe. Various factors can affect the level of intoxication that occurs, such as the person's age, gender, weight, frequency of alcohol consumption, and the presence of other medical conditions (such as diabetes, seizures, or heart conditions). Dangerous levels of alcohol intoxication may occur when people drink large amounts of alcohol in a short period (binge drinking). Alcohol can also be especially dangerous when combined with certain prescription medicines or "recreational" drugs. °SIGNS AND SYMPTOMS °Some common signs and symptoms of mild alcohol intoxication include: °· Loss of coordination. °· Changes in mood and behavior. °· Impaired judgment. °· Slurred speech. °As alcohol intoxication progresses to more severe levels, other signs and symptoms will appear. These may include: °· Vomiting. °· Confusion and impaired memory. °· Slowed breathing. °· Seizures. °· Loss of consciousness. °DIAGNOSIS  °Your health care provider will take a medical history and perform a physical exam. You will be asked about the amount and type of alcohol you have consumed. Blood tests will be done to measure the concentration of alcohol in your blood. In many places, your blood alcohol level must be lower than 80 mg/dL (0.08%) to legally drive. However, many dangerous effects of alcohol can occur at much lower levels.  °TREATMENT  °People with alcohol intoxication often do not require treatment. Most of the effects of alcohol intoxication are temporary, and they go away as the alcohol naturally  leaves the body. Your health care provider will monitor your condition until you are stable enough to go home. Fluids are sometimes given through an IV access tube to help prevent dehydration.  °HOME CARE INSTRUCTIONS °· Do not drive after drinking alcohol. °· Stay hydrated. Drink enough water and fluids to keep your urine clear or pale yellow. Avoid caffeine.   °· Only take over-the-counter or prescription medicines as directed by your health care provider.   °SEEK MEDICAL CARE IF:  °· You have persistent vomiting.   °· You do not feel better after a few days. °· You have frequent alcohol intoxication. Your health care provider can help determine if you should see a substance use treatment counselor. °SEEK IMMEDIATE MEDICAL CARE IF:  °· You become shaky or tremble when you try to stop drinking.   °· You shake uncontrollably (seizure).   °· You throw up (vomit) blood. This may be bright red or may look like black coffee grounds.   °· You have blood in your stool. This may be bright red or may appear as a black, tarry, bad smelling stool.   °· You become lightheaded or faint.   °MAKE SURE YOU:  °· Understand these instructions. °· Will watch your condition. °· Will get help right away if you are not doing well or get worse. °Document Released: 11/24/2004 Document Revised: 10/17/2012 Document Reviewed: 07/20/2012 °ExitCare® Patient Information ©2015 [employer]. This information is not intended to replace advice given to you by your health care provider. Make sure you discuss any questions you have with your health care provider. ° °Alcohol Use Disorder °Alcohol use disorder is a mental disorder. It is not a one-time incident of heavy drinking. Alcohol use disorder is the excessive and   uncontrollable use of alcohol over time that leads to problems with functioning in one or more areas of daily living. People with this disorder risk harming themselves and others when they drink to excess. Alcohol use disorder also  can cause other mental disorders, such as mood and anxiety disorders, and serious physical problems. People with alcohol use disorder often misuse other drugs.  Alcohol use disorder is common and widespread. Some people with this disorder drink alcohol to cope with or escape from negative life events. Others drink to relieve chronic pain or symptoms of mental illness. People with a family history of alcohol use disorder are at higher risk of losing control and using alcohol to excess.  SYMPTOMS  Signs and symptoms of alcohol use disorder may include the following:   Consumption ofalcohol inlarger amounts or over a longer period of time than intended.  Multiple unsuccessful attempts to cutdown or control alcohol use.   A great deal of time spent obtaining alcohol, using alcohol, or recovering from the effects of alcohol (hangover).  A strong desire or urge to use alcohol (cravings).   Continued use of alcohol despite problems at work, school, or home because of alcohol use.   Continued use of alcohol despite problems in relationships because of alcohol use.  Continued use of alcohol in situations when it is physically hazardous, such as driving a car.  Continued use of alcohol despite awareness of a physical or psychological problem that is likely related to alcohol use. Physical problems related to alcohol use can involve the brain, heart, liver, stomach, and intestines. Psychological problems related to alcohol use include intoxication, depression, anxiety, psychosis, delirium, and dementia.   The need for increased amounts of alcohol to achieve the same desired effect, or a decreased effect from the consumption of the same amount of alcohol (tolerance).  Withdrawal symptoms upon reducing or stopping alcohol use, or alcohol use to reduce or avoid withdrawal symptoms. Withdrawal symptoms include:  Racing heart.  Hand tremor.  Difficulty  sleeping.  Nausea.  Vomiting.  Hallucinations.  Restlessness.  Seizures. DIAGNOSIS Alcohol use disorder is diagnosed through an assessment by your health care provider. Your health care provider may start by asking three or four questions to screen for excessive or problematic alcohol use. To confirm a diagnosis of alcohol use disorder, at least two symptoms must be present within a 106-monthperiod. The severity of alcohol use disorder depends on the number of symptoms:  Mild--two or three.  Moderate--four or five.  Severe--six or more. Your health care provider may perform a physical exam or use results from lab tests to see if you have physical problems resulting from alcohol use. Your health care provider may refer you to a mental health professional for evaluation. TREATMENT  Some people with alcohol use disorder are able to reduce their alcohol use to low-risk levels. Some people with alcohol use disorder need to quit drinking alcohol. When necessary, mental health professionals with specialized training in substance use treatment can help. Your health care provider can help you decide how severe your alcohol use disorder is and what type of treatment you need. The following forms of treatment are available:   Detoxification. Detoxification involves the use of prescription medicines to prevent alcohol withdrawal symptoms in the first week after quitting. This is important for people with a history of symptoms of withdrawal and for heavy drinkers who are likely to have withdrawal symptoms. Alcohol withdrawal can be dangerous and, in severe cases, cause death. Detoxification  is usually provided in a hospital or in-patient substance use treatment facility.  Counseling or talk therapy. Talk therapy is provided by substance use treatment counselors. It addresses the reasons people use alcohol and ways to keep them from drinking again. The goals of talk therapy are to help people with alcohol  use disorder find healthy activities and ways to cope with life stress, to identify and avoid triggers for alcohol use, and to handle cravings, which can cause relapse.  Medicines.Different medicines can help treat alcohol use disorder through the following actions:  Decrease alcohol cravings.  Decrease the positive reward response felt from alcohol use.  Produce an uncomfortable physical reaction when alcohol is used (aversion therapy).  Support groups. Support groups are run by people who have quit drinking. They provide emotional support, advice, and guidance. These forms of treatment are often combined. Some people with alcohol use disorder benefit from intensive combination treatment provided by specialized substance use treatment centers. Both inpatient and outpatient treatment programs are available. Document Released: 03/24/2004 Document Revised: 07/01/2013 Document Reviewed: 05/24/2012 [facility] Patient Information 2015 [employer], [place]. This information is not intended to replace advice given to you by your health care provider. Make sure you discuss any questions you have with your health care provider.  Behavioral Health Resources in the [facility]  Intensive Outpatient Programs: [facility]      [address] [place], [place] [phone] Both a day and evening program       [facility]     [address]        [place], [place] [address] [phone]         [facility]: [facility] [address] [place] [phone]  [facility] ACCESS LINE: [phone] or [phone] [address] [place], [place] [address] [email or web address]  Mobile Crisis Teams:                                        [facility]         Mobile Crisis Care Unit [phone]             [facility] [address]  [place] [phone]                                         Interventionist [address] [place] [phone]  Self-Help/Support Groups: Mental Health Assoc. of [place] Variety of support groups [phone] (call for more info)  Narcotics Anonymous (NA) Caring Services [address] [place] - 2 meetings at this location  Residential Treatment Programs:  [facility]      [address]        [place]       [phone]         [facility] [address], [place] [address] [place], [place]  [id number] [phone]  [facility]  [address] [place], [place] [address] [phone] Admissions: 8am-3pm M-F  [facility]     [address]        [place], [place] [address]       [phone]         [facility] [phone]  [address] [place], [place] [phone]  [facility] [address] [place], [place] [phone]  [facility] Int[facility] Out[facility]      [address]     [place], [place]       [phone]         [facility]     [address] [place], [place] [phone] or [phone]  [facility] ([facility])  [address] [place], [place] [phone]  [facility] [address]                                               [address] [place] [phone]  [facility] Resources: [phone]               General Therapy                                                Angie FavaJulie Brannon, PhD        [address]                                       [place], [place] [address]         [phone]   Insurance  [facility]   [address] [place], [place] [address] [phone]  [facility] [address] [place], [place] [address] [phone] Insurance/Medicaid/sponsorship through  [facility]  [facility] Families                                              [address]                                        [place], [place] [address]    Therapy/tele-psych/case         [phone]          [facility] [address].   [place], [place]  [address]  Adolescent/group home/case management [phone]                                           Creola CornJulia Brannon PhD       General therapy       Insurance   [id number] [phone]         Dr. Lolly MustacheArfeen Insurance [phone] M-F   Detox/Residential Medicaid, sponsorship [phone]

## 2014-01-05 NOTE — ED Provider Notes (Addendum)
CSN: [id number]     Arrival date & time 01/05/14  0057 History   First MD Initiated Contact with Patient 01/05/14 0110     Chief Complaint  Patient presents with  . Alcohol Intoxication     (Consider location/radiation/quality/duration/timing/severity/associated sxs/prior Treatment) HPI 53 year old male presents to the emergency department via EMS after being found intoxicated in a laundromat.  Patient was refusing to leave, and [employer] was called.  Patient too intoxicated walk, so was brought to the ER.  Patient without complaints at this time.  Reports that he has been drinking beer.  He also admits drinking Listerine.  No SI no HI.  Patient not requesting detox at this time.  A dominant denies any abdominal pain nausea vomiting chest pain shortness of breath. Past Medical History  Diagnosis Date  . Hypertension   . Degenerative disc disease   . Depression    Past Surgical History  Procedure Laterality Date  . Cervical spine surgery     History reviewed. No pertinent family history. History  Substance Use Topics  . Smoking status: Current Some Day Smoker  . Smokeless tobacco: Not on file  . Alcohol Use: Yes    Review of Systems  Unable to perform ROS: Other  extremely intoxicated    Allergies  Lisinopril; Other; and Sulfonamide derivatives  Home Medications   Prior to Admission medications   Medication Sig Start Date End Date Taking? Authorizing Provider  clindamycin (CLEOCIN) 150 MG capsule Take 1 capsule (150 mg total) by mouth every 6 (six) hours. 11/19/13   Arby BarretteMarcy Pfeiffer, MD  trimethoprim-polymyxin b (POLYTRIM) ophthalmic solution Place 1 drop into the right eye every 4 (four) hours. 11/19/13   Arby BarretteMarcy Pfeiffer, MD   BP 112/83 mmHg  Pulse 76  Temp(Src) 97.4 F (36.3 C) (Oral)  Resp 16  Ht 5\' 5"  (1.651 m)  Wt 148 lb (67.132 kg)  BMI 24.63 kg/m2  SpO2 98% Physical Exam  Constitutional: He is oriented to person, place, and time. He appears well-developed and  well-nourished. No distress.  HENT:  Head: Normocephalic and atraumatic.  Nose: Nose normal.  Mouth/Throat: Oropharynx is clear and moist.  Eyes: Conjunctivae and EOM are normal. Pupils are equal, round, and reactive to light.  Nystagmus  noted  Neck: Normal range of motion. Neck supple. No JVD present. No tracheal deviation present. No thyromegaly present.  Cardiovascular: Normal rate, regular rhythm, normal heart sounds and intact distal pulses.  Exam reveals no gallop and no friction rub.   No murmur heard. Pulmonary/Chest: Effort normal and breath sounds normal. No stridor. No respiratory distress. He has no wheezes. He has no rales. He exhibits no tenderness.  Abdominal: Soft. Bowel sounds are normal. He exhibits no distension and no mass. There is no tenderness. There is no rebound and no guarding.  Musculoskeletal: Normal range of motion. He exhibits no edema or tenderness.  Lymphadenopathy:    He has no cervical adenopathy.  Neurological: He is alert and oriented to person, place, and time. He displays normal reflexes. He exhibits normal muscle tone. Coordination (ataxicmovement of arms and legs) abnormal.  Skin: Skin is warm and dry. No rash noted. No erythema. No pallor.  Psychiatric: He has a normal mood and affect. His behavior is normal. Judgment and thought content normal.  Nursing note and vitals reviewed.   ED Course  Procedures (including critical care time) Labs Review Labs Reviewed - No data to display  Imaging Review No results found.   EKG Interpretation None  MDM   Final diagnoses:  Alcohol intoxication, uncomplicated   53 year old male with alcohol intoxication, has history of alcoholism, has been seen in the emergency room for similar.  Patient without complaints at this time.  We'll allow to sober and discharge once able to ambulate without falling   6:55 AM Pt ambulated at 615, still very unsteady on his feet.  Will allow to sober  further.  Olivia Mackielga M Wyman Meschke, MD 01/05/14 [phone]  7:16 AM Patient now reports that he feels strongly that he needs to get alcohol detox.  He reports past history of DTs and alcohol withdrawal seizures.  Will initiate labs and TTS consult.  Olivia Mackielga M Raiya Stainback, MD 01/05/14 [phone]

## 2014-01-06 ENCOUNTER — Inpatient Hospital Stay (HOSPITAL_COMMUNITY)
Admission: AD | Admit: 2014-01-06 | Discharge: 2014-01-15 | DRG: 896 | Disposition: A | Discharge status: Home / Self Care | Payer: PRIVATE HEALTH INSURANCE | Source: Intra-hospital | Attending: Psychiatry | Admitting: Psychiatry

## 2014-01-06 ENCOUNTER — Encounter (HOSPITAL_COMMUNITY): Payer: Self-pay | Admitting: *Deleted

## 2014-01-06 DIAGNOSIS — F17210 Nicotine dependence, cigarettes, uncomplicated: Secondary | ICD-10-CM | POA: Diagnosis present

## 2014-01-06 DIAGNOSIS — I10 Essential (primary) hypertension: Secondary | ICD-10-CM | POA: Diagnosis present

## 2014-01-06 DIAGNOSIS — N200 Calculus of kidney: Secondary | ICD-10-CM | POA: Insufficient documentation

## 2014-01-06 DIAGNOSIS — J189 Pneumonia, unspecified organism: Secondary | ICD-10-CM | POA: Diagnosis present

## 2014-01-06 DIAGNOSIS — F1024 Alcohol dependence with alcohol-induced mood disorder: Secondary | ICD-10-CM | POA: Insufficient documentation

## 2014-01-06 DIAGNOSIS — F1019 Alcohol abuse with unspecified alcohol-induced disorder: Secondary | ICD-10-CM | POA: Diagnosis not present

## 2014-01-06 DIAGNOSIS — F10129 Alcohol abuse with intoxication, unspecified: Secondary | ICD-10-CM | POA: Diagnosis present

## 2014-01-06 DIAGNOSIS — F10229 Alcohol dependence with intoxication, unspecified: Principal | ICD-10-CM | POA: Diagnosis present

## 2014-01-06 DIAGNOSIS — F10120 Alcohol abuse with intoxication, uncomplicated: Secondary | ICD-10-CM | POA: Diagnosis not present

## 2014-01-06 LAB — ETHANOL: Alcohol, Ethyl (B): <11 mg/dL (ref 0–11)

## 2014-01-06 MED ORDER — THIAMINE HCL 100 MG/ML IJ SOLN: 100.0000 mg | Freq: Once | INTRAMUSCULAR | Status: DC

## 2014-01-06 MED ORDER — ACETAMINOPHEN 325 MG PO TABS
650.0000 mg | ORAL_TABLET | Freq: Four times a day (QID) | ORAL | Status: DC | PRN
  Administered 2014-01-08: 650 mg via ORAL
  Filled 2014-01-06: qty 2

## 2014-01-06 MED ORDER — TRAZODONE HCL 50 MG PO TABS
50.0000 mg | ORAL_TABLET | Freq: Every evening | ORAL | Status: DC | PRN
  Administered 2014-01-06 – 2014-01-08 (×3): 50 mg via ORAL
  Filled 2014-01-06 (×9): qty 1

## 2014-01-06 MED ORDER — LOPERAMIDE HCL 2 MG PO CAPS: 2.0000 mg | ORAL_CAPSULE | ORAL | Status: AC | PRN

## 2014-01-06 MED ORDER — ADULT MULTIVITAMIN W/MINERALS CH
1.0000 | ORAL_TABLET | Freq: Every day | ORAL | Status: DC
  Administered 2014-01-07 – 2014-01-15 (×9): 1 via ORAL
  Filled 2014-01-06 (×12): qty 1

## 2014-01-06 MED ORDER — LORAZEPAM 1 MG PO TABS
1.0000 mg | ORAL_TABLET | Freq: Four times a day (QID) | ORAL | Status: AC | PRN
  Administered 2014-01-07: 1 mg via ORAL
  Filled 2014-01-06: qty 2

## 2014-01-06 MED ORDER — LORAZEPAM 1 MG PO TABS
1.0000 mg | ORAL_TABLET | Freq: Every day | ORAL | Status: AC
  Administered 2014-01-11: 1 mg via ORAL
  Filled 2014-01-06: qty 1

## 2014-01-06 MED ORDER — ONDANSETRON 4 MG PO TBDP
4.0000 mg | ORAL_TABLET | Freq: Four times a day (QID) | ORAL | Status: AC | PRN
  Administered 2014-01-09 (×2): 4 mg via ORAL
  Filled 2014-01-06 (×2): qty 1

## 2014-01-06 MED ORDER — VITAMIN B-1 100 MG PO TABS
100.0000 mg | ORAL_TABLET | Freq: Every day | ORAL | Status: DC
  Administered 2014-01-07 – 2014-01-15 (×9): 100 mg via ORAL
  Filled 2014-01-06 (×13): qty 1

## 2014-01-06 MED ORDER — MAGNESIUM HYDROXIDE 400 MG/5ML PO SUSP: 30.0000 mL | Freq: Every day | ORAL | Status: DC | PRN

## 2014-01-06 MED ORDER — LORAZEPAM 1 MG PO TABS
1.0000 mg | ORAL_TABLET | Freq: Three times a day (TID) | ORAL | Status: AC
  Administered 2014-01-08 – 2014-01-09 (×3): 1 mg via ORAL
  Filled 2014-01-06 (×3): qty 1

## 2014-01-06 MED ORDER — LORAZEPAM 1 MG PO TABS
2.0000 mg | ORAL_TABLET | Freq: Once | ORAL | Status: AC
  Administered 2014-01-06: 2 mg via ORAL
  Filled 2014-01-06: qty 2

## 2014-01-06 MED ORDER — HYDROXYZINE HCL 25 MG PO TABS
25.0000 mg | ORAL_TABLET | Freq: Four times a day (QID) | ORAL | Status: AC | PRN
  Administered 2014-01-08: 25 mg via ORAL
  Filled 2014-01-06 (×2): qty 1

## 2014-01-06 MED ORDER — LORAZEPAM 1 MG PO TABS
1.0000 mg | ORAL_TABLET | Freq: Four times a day (QID) | ORAL | Status: AC
  Administered 2014-01-06 – 2014-01-08 (×6): 1 mg via ORAL
  Filled 2014-01-06 (×2): qty 1
  Filled 2014-01-06: qty 2
  Filled 2014-01-06 (×2): qty 1
  Filled 2014-01-06: qty 2

## 2014-01-06 MED ORDER — ALUM & MAG HYDROXIDE-SIMETH 200-200-20 MG/5ML PO SUSP: 30.0000 mL | ORAL | Status: DC | PRN

## 2014-01-06 MED ORDER — CLONIDINE HCL 0.1 MG PO TABS
0.1000 mg | ORAL_TABLET | Freq: Once | ORAL | Status: AC
  Administered 2014-01-06: 0.1 mg via ORAL
  Filled 2014-01-06: qty 1

## 2014-01-06 MED ORDER — LORAZEPAM 1 MG PO TABS
1.0000 mg | ORAL_TABLET | Freq: Two times a day (BID) | ORAL | Status: AC
  Administered 2014-01-09 – 2014-01-10 (×2): 1 mg via ORAL
  Filled 2014-01-06 (×2): qty 1

## 2014-01-06 NOTE — ED Notes (Signed)
Spoke with EDP regarding Behavioral Health request verbal order placed for repeat ethanol.

## 2014-01-06 NOTE — ED Provider Notes (Signed)
The patient has been accepted to behavioral health for alcohol dependence and withdrawal treatment. The patient reports he has a history of hypertension but cannot recall what medication he was supposed to be on. His mental status is clear this point time he is not having any active hallucinations. He has no respiratory distress. Patient has minor tremor of the hands. The skin is warm and dry. At this point time he will be treated with clonidine for elevated blood pressure and continuing the CIWA protocol with an additional dose of Ativan.  Arby BarretteMarcy Ardon Franklin, MD 01/06/14 [phone]

## 2014-01-06 NOTE — Progress Notes (Signed)
NSG Admit Note: 53 yo male admitted vol. for alcohol detox after EMS and [employer] found pt intoxicated  And a laundromat and refusing to leave the premises. [facility] by [facility]. Pt admits to severe alcohol abuse and is seeking detox. States he was recently evicted and is now homeless among other stressors. Pt also admits to marijuana use on a weekly basis. Pt has hx of inpt and outpt services as well as previous admit to our OBS unit earlier this year. Pt is oriented to unit rules and expectations and agrees with treatment guidelines. No complaints of pain or problems at this time.

## 2014-01-06 NOTE — Tx Team (Signed)
Initial Interdisciplinary Treatment Plan   PATIENT STRESSORS: Substance abuse   PATIENT STRENGTHS: Ability for insight General fund of knowledge   PROBLEM LIST: Problem List/Patient Goals Date to be addressed Date deferred Reason deferred Estimated date of resolution  ETOH abuse 01/06/14     depression 01/06/14                                                DISCHARGE CRITERIA:  Ability to meet basic life and health needs Withdrawal symptoms are absent or subacute and managed without 24-hour nursing intervention  PRELIMINARY DISCHARGE PLAN: Attend aftercare/continuing care group  PATIENT/FAMIILY INVOLVEMENT: This treatment plan has been presented to and reviewed with the patient, Hoover BrownsJames Franchino, and/or family member, .  The patient and family have been given the opportunity to ask questions and make suggestions.  Izmael Duross, [place] 01/06/2014, 11:04 PM

## 2014-01-06 NOTE — Progress Notes (Signed)
CSW filled out prescreen form for [facility]) and faxed referral documents, which included notes and labs. CSW also faxed the required referral documents to [facility].    Trish MageBrittney Ki Luckman, LCSWA [phone] ED CSW 01/06/2014 11:38 AM

## 2014-01-06 NOTE — ED Notes (Signed)
Report called to [facility] 

## 2014-01-06 NOTE — Progress Notes (Signed)
The focus of this group is to help patients review their daily goal of treatment and discuss progress on daily workbooks. Pt did not attend group, despite being encouraged to do so by staff.

## 2014-01-06 NOTE — ED Notes (Signed)
Behavioral Health called requested a repeat ethanol level. Attempting to find inpatient facility [facility] or [facility].

## 2014-01-07 DIAGNOSIS — F10239 Alcohol dependence with withdrawal, unspecified: Secondary | ICD-10-CM

## 2014-01-07 DIAGNOSIS — F329 Major depressive disorder, single episode, unspecified: Secondary | ICD-10-CM

## 2014-01-07 MED ORDER — NICOTINE POLACRILEX 2 MG MT GUM
2.0000 mg | CHEWING_GUM | OROMUCOSAL | Status: DC | PRN
Start: 2014-01-07 — End: 2014-01-15

## 2014-01-07 MED ORDER — AMLODIPINE BESYLATE 5 MG PO TABS
5.0000 mg | ORAL_TABLET | Freq: Every day | ORAL | Status: DC
  Administered 2014-01-07 – 2014-01-08 (×2): 5 mg via ORAL
  Filled 2014-01-07 (×6): qty 1

## 2014-01-07 MED ORDER — SERTRALINE HCL 50 MG PO TABS
50.0000 mg | ORAL_TABLET | Freq: Every day | ORAL | Status: DC
  Administered 2014-01-08 – 2014-01-10 (×3): 50 mg via ORAL
  Filled 2014-01-07 (×6): qty 1

## 2014-01-07 NOTE — [facility] Group Notes (Signed)
[facility] LCSW Group Therapy  01/07/2014   1:15 PM   Type of Therapy:  Group Therapy  Participation Level:  Active  Participation Quality:  Attentive, Sharing and Supportive  Affect:  Depressed and Flat  Cognitive:  Alert and Oriented  Insight:  Developing/Improving and Engaged  Engagement in Therapy:  Developing/Improving and Engaged  Modes of Intervention:  Clarification, Confrontation, Discussion, Education, Exploration, Limit-setting, Orientation, Problem-solving, Rapport Building, Dance movement psychotherapisteality Testing, Socialization and Support  Summary of Progress/Problems: The topic for group therapy was feelings about diagnosis.  Pt actively participated in group discussion on their past and current diagnosis and how they feel towards this.  Pt also identified how society and family members judge them, based on their diagnosis as well as stereotypes and stigmas.  Patient related his diagnosis to hell. Patient discussed his struggle with addiction. CSW's and other group members provided emotional support and encouragement.  Samuella BruinKristin Kreig Parson, MSW, [employer]CSWA Clinical Social Worker [facility] [phone]

## 2014-01-07 NOTE — BHH Counselor (Signed)
Adult Comprehensive Assessment  Patient ID: Richard Dunn, male   DOB: 05/06/1960, 53 y.o.   MRN: [id number]  Information Source: Information source: Patient  Current Stressors:  Educational / Learning stressors: N/A Employment / Job issues: N/A on disability Family Relationships: estranged from family Financial / Lack of resources (include bankruptcy): lack of income Housing / Lack of housing: lost housing in September, has been staying in friend's garage Physical health (include injuries & life threatening diseases): high blood pressure, pains in neck associated with surgery Social relationships: N/A Substance abuse: Daily alcohol use- fifth of liquor on average; marijuana once or twice bi-weekly  Bereavement / Loss: lost housing in September  Living/Environment/Situation:  Living Arrangements: Non-relatives/Friends Living conditions (as described by patient or guardian): living in friend's garage since September 2015 How long has patient lived in current situation?: a few weeks What is atmosphere in current home: Comfortable  Family History:  Marital status: Divorced Divorced, when?: 2004 What types of issues is patient dealing with in the relationship?: N/A Additional relationship information: N/A Does patient have children?: Yes How many children?: 4 How is patient's relationship with their children?: does not have a relationship with his step children  Childhood History:  By whom was/is the patient raised?: Both parents Description of patient's relationship with caregiver when they were a child: close Patient's description of current relationship with people who raised him/her: both are deceased- 21984 and [id number] Does patient have siblings?: Yes Number of Siblings: 1 Description of patient's current relationship with siblings: limited contact with 1 sister [place] in [place] Did patient suffer any verbal/emotional/physical/sexual abuse as a child?: No Did patient suffer from  severe childhood neglect?: No Has patient ever been sexually abused/assaulted/raped as an adolescent or adult?: No Was the patient ever a victim of a crime or a disaster?: No Witnessed domestic violence?: No Has patient been effected by domestic violence as an adult?: No  Education:  Highest grade of school patient has completed: GED Currently a Consulting civil engineerstudent?: No Learning disability?: No  Employment/Work Situation:   Employment situation: On disability Why is patient on disability: related to neck surgery  How long has patient been on disability: since 2012 Patient's job has been impacted by current illness: No What is the longest time patient has a held a job?: 8 years Where was the patient employed at that time?: [employer] Has patient ever been in the [employer] and Companymilitary?: No Has patient ever served in Buyer, retailcombat?: No  Financial Resources:   Financial resources: Insurance claims handlereceives SSDI Does patient have a Lawyerrepresentative payee or guardian?: No  Alcohol/Substance Abuse:   What has been your use of drugs/alcohol within the last 12 months?: Daily alcohol use- fifth of liquor on average; marijuana once or twice bi-weekly  If attempted suicide, did drugs/alcohol play a role in this?: No Alcohol/Substance Abuse Treatment Hx: Past Tx, Inpatient, Past detox If yes, describe treatment: multiple treatments in past- most recent was [facility] 2 years ago Has alcohol/substance abuse ever caused legal problems?: Yes (drunk and disorderly and possession charge in February 2014)  Social Support System:   Patient's Community Support System: Poor Describe Community Support System: one or two friends Type of faith/religion: Ephriam KnucklesChristian How does patient's faith help to cope with current illness?: unknown  Leisure/Recreation:   Leisure and Hobbies: fishing, playing football and basketball  Strengths/Needs:   What things does the patient do well?: patient unable to answer at this time In what areas does patient struggle /  problems for patient: thinking back on  hard times, relationships  Discharge Plan:   Does patient have access to transportation?: Yes (via bus) Will patient be returning to same living situation after discharge?: Yes Currently receiving community mental health services: Yes (From Whom) (patient cannot remember name of agency) If no, would patient like referral for services when discharged?: No Does patient have financial barriers related to discharge medications?: No  Summary/Recommendations:     Patient is a 53 year old Caucasian Male with a diagnosis of Alcohol Dependence, Alcohol Withdrawal, Depression NOS, consider Alcohol Induced . [place], has been staying in a friend's garage since losing his housing in September. Patient cannot remember the events leading to current hospitalization. Admission notes indicate that patient was intoxicated at a laundromat. Patient has had multiple residential treatment stays before, the most recent being [facility] approximately 2 years ago. Patient is not interested in residential treatment at this time. Patient plans to return to his friend's garage and states that he will follow up with an outpatient provider [address] but cannot recall the name of agency at this time. Patient identified his goals as to "gey my life back on track." Patient reports increasing confusion, memory loss, and blacking out over the past year. Patient will benefit from crisis stabilization, medication evaluation, group therapy, and psycho education in addition to case management for discharge planning. Patient and CSW reviewed pt's identified goals and treatment plan. Pt verbalized understanding and agreed to treatment plan.   Elliot Simoneaux, West CarboKristin L. 01/07/2014

## 2014-01-07 NOTE — B[facility] Group Notes (Signed)
Adult Psychoeducational Group Note  Date:  01/07/2014 Time:  10:32 PM  Group Topic/Focus:  AA Meeting  Participation Level:  Minimal  Participation Quality:  Appropriate  Affect:  Flat  Cognitive:  Appropriate  Insight: Limited  Engagement in Group:  Limited  Modes of Intervention:  Discussion and Education  Additional Comments:  Fayrene FearingJames attended group.  Caroll RancherLindsay, Ardis Lawley A 01/07/2014, 10:32 PM

## 2014-01-07 NOTE — Progress Notes (Signed)
Recreation Therapy Notes  Animal-Assisted Activity/Therapy (AAA/T) Program Checklist/Progress Notes Patient Eligibility Criteria Checklist & Daily Group note for Rec Tx Intervention  Date: 11.10.2015 Time: 2:45pm Location: [address] Day[address]   AAA/T Program Assumption of Risk Form signed by Patient/ or Parent Legal Guardian yes  Patient is free of allergies or sever asthma yes  Patient reports no fear of animals yes  Patient reports no history of cruelty to animals yes   Patient understands his/her participation is voluntary yes  Behavioral Response: Did not attend.   Marykay Lexenise L Taelyn Broecker, LRT/CTRS  Jearl KlinefelterBlanchfield, David Rodriquez L 01/07/2014 4:43 PM

## 2014-01-07 NOTE — H&P (Signed)
Psychiatric Admission Assessment Adult  Patient Identification:  Richard Dunn Date of Evaluation:  01/07/2014 Chief Complaint:  " I really don't remember how I got to hospital" History of Present Illness:: Patient is a 53 year old man, who has a history of alcohol dependence. As per chart notes, he was found  In a laundromat intoxicated , drinking listerine, and unable to walk  and brought to hospital . Patient has no memory of this and his last memory before hospital is " going in to the [place] because it was chilly outside". Patient states he has been depressed recently, and also endorses history of alcohol dependence . He states he drinks daily, amounts vary depending on availability but often more than a bottle of liquor or wine. Drinks listerine when cannot afford other ETOH.  Describes neuro-vegetative symptoms of depression as below Elements:  Worsening depression, severe alcohol dependence  Associated Signs/Synptoms: Depression Symptoms:  depressed mood, anhedonia, insomnia, difficulty concentrating, loss of energy/fatigue, disturbed sleep, decreased labido, (Hypo) Manic Symptoms:  Denies  Anxiety Symptoms:  Reports a sense of subjective anxiety/worry Psychotic Symptoms:Denies  PTSD Symptoms: Does not endorse  Total Time spent with patient: 45 minutes  Psychiatric Specialty Exam: Physical Exam  Review of Systems  Constitutional: Negative for fever and chills.  Respiratory: Negative for cough and shortness of breath.   Cardiovascular: Negative for chest pain.  Gastrointestinal: Negative for vomiting and blood in stool.  Skin: Negative for rash.  Neurological: Positive for loss of consciousness and headaches. Negative for seizures.  Psychiatric/Behavioral: Positive for depression and substance abuse.    Blood pressure 150/101, pulse 78, temperature 97.9 F (36.6 C), temperature source Oral, resp. rate 18.There is no weight on file to calculate BMI.  General  Appearance: Fairly Groomed  Engineer, water::  Good  Speech:  Normal Rate  Volume:  Decreased  Mood:  Depressed  Affect:  Constricted  Thought Process:  Goal Directed and Linear  Orientation:  Other:  fully alert and attentive, no evidence of confusion or delirium at this time   Thought Content:  denies hallucinations, no delusions   Suicidal Thoughts:  No- at this time denies any thoughts of hurting self or anyone else   Homicidal Thoughts:  No  Memory:  recent and remote fair, describes recent blackouts with memory lapses   Judgement:  Fair  Insight:  Fair  Psychomotor Activity:  Decreased  Concentration:  Good  Recall:  Good  Fund of Knowledge:Good  Language: Good  Akathisia:  Negative  Handed:  Right  AIMS (if indicated):     Assets:  Communication Skills Desire for Improvement Resilience  Sleep:  Number of Hours: 6.5    Musculoskeletal: Strength & Muscle Tone: within normal limits Gait & Station: normal Patient leans: N/A  Past Psychiatric History: Diagnosis:patient describes a long history of alcohol dependence- describes history of depression, does not endorse mania or psychosis or PTSD at this time  Hospitalizations: prior admissions for detox, multiple visits to ED due to alcohol intoxication  Outpatient Care: none current   Substance Abuse Care: none current   Self-Mutilation: denies   Suicidal Attempts: denies   Violent Behaviors: denies    Past Medical History:  Patient states he had cervical sppine surgery in 2010. Past Medical History  Diagnosis Date  . Hypertension   . Degenerative disc disease   . Depression    Loss of Consciousness:  describes periods of memory lose and blacking out, likely result of alcohol Seizure History:  denies seizure  history Allergies:   Allergies  Allergen Reactions  . Lisinopril Swelling    (Angioedema)  . Other     Mycin Drugs  . Sulfonamide Derivatives Other (See Comments)    Childhood allergy   PTA Medications: No  prescriptions prior to admission    Previous Psychotropic Medications:  Medication/Dose  Does not endorse prior psychiatric medications at this time               Substance Abuse History in the last 12 months:  Yes.  - alcohol dependence as noted above   Consequences of Substance Abuse: blackouts, DUIs in the past ( #4)- no longer drives , denies withdrawal seizures  Social History:  reports that he has been smoking Cigarettes.  He has been smoking about 1.50 packs per day. He does not have any smokeless tobacco history on file. He reports that he drinks alcohol. He reports that he uses illicit drugs (Marijuana). Additional Social History: Pain Medications: see med list Prescriptions: see med list Over the Counter: see med list History of alcohol / drug use?: Yes Longest period of sobriety (when/how long): unknown Negative Consequences of Use: Financial, Personal relationships Withdrawal Symptoms: Tremors, Patient aware of relationship between substance abuse and physical/medical complications  Current Place of Residence:  " I stay with a friend"- currently homeless  Place of Birth:   Family Members: Marital Status:  Divorced Children: None   Sons:  Daughters: Relationships: No SO at this time Education:  HS Soil scientist Problems/Performance: Religious Beliefs/Practices: History of Abuse (Emotional/Phsycial/Sexual) Occupational Experiences; on Office manager History:  None. Legal History: None currently Hobbies/Interests:  Family History:  Parents died years ago, one half sister with whom he has no contact, no suicides, no mental illness, no addictive illness in family endorsed  Results for orders placed or performed during the hospital encounter of 01/05/14 (from the past 72 hour(s))  Acetaminophen level     Status: None   Collection Time: 01/05/14  7:24 AM  Result Value Ref Range   Acetaminophen (Tylenol), Serum <15.0 10 - 30 ug/mL    Comment:         THERAPEUTIC CONCENTRATIONS VARY SIGNIFICANTLY. A RANGE OF 10-30 ug/mL MAY BE AN EFFECTIVE CONCENTRATION FOR MANY PATIENTS. HOWEVER, SOME ARE BEST TREATED AT CONCENTRATIONS OUTSIDE THIS RANGE. ACETAMINOPHEN CONCENTRATIONS >150 ug/mL AT 4 HOURS AFTER INGESTION AND >50 ug/mL AT 12 HOURS AFTER INGESTION ARE OFTEN ASSOCIATED WITH TOXIC REACTIONS.   CBC     Status: Abnormal   Collection Time: 01/05/14  7:24 AM  Result Value Ref Range   WBC 4.4 4.0 - 10.5 K/uL   RBC 4.00 (L) 4.22 - 5.81 MIL/uL   Hemoglobin 13.8 13.0 - 17.0 g/dL   HCT 40.5 39.0 - 52.0 %   MCV 101.3 (H) 78.0 - 100.0 fL   MCH 34.5 (H) 26.0 - 34.0 pg   MCHC 34.1 30.0 - 36.0 g/dL   RDW 13.8 11.5 - 15.5 %   Platelets 197 150 - 400 K/uL  Comprehensive metabolic panel     Status: Abnormal   Collection Time: 01/05/14  7:24 AM  Result Value Ref Range   Sodium 148 (H) 137 - 147 mEq/L   Potassium 3.4 (L) 3.7 - 5.3 mEq/L   Chloride 105 96 - 112 mEq/L   CO2 28 19 - 32 mEq/L   Glucose, Bld 86 70 - 99 mg/dL   BUN 4 (L) 6 - 23 mg/dL   Creatinine, Ser 0.46 (L) 0.50 - 1.35 mg/dL  Calcium 7.7 (L) 8.4 - 10.5 mg/dL   Total Protein 6.7 6.0 - 8.3 g/dL   Albumin 2.6 (L) 3.5 - 5.2 g/dL   AST 166 (H) 0 - 37 U/L   ALT 71 (H) 0 - 53 U/L   Alkaline Phosphatase 224 (H) 39 - 117 U/L   Total Bilirubin 0.3 0.3 - 1.2 mg/dL   GFR calc non Af Amer >90 >90 mL/min   GFR calc Af Amer >90 >90 mL/min    Comment: (NOTE) The eGFR has been calculated using the CKD EPI equation. This calculation has not been validated in all clinical situations. eGFR's persistently <90 mL/min signify possible Chronic Kidney Disease.    Anion gap 15 5 - 15  Ethanol (ETOH)     Status: Abnormal   Collection Time: 01/05/14  7:24 AM  Result Value Ref Range   Alcohol, Ethyl (B) 344 (H) 0 - 11 mg/dL    Comment:        LOWEST DETECTABLE LIMIT FOR SERUM ALCOHOL IS 11 mg/dL FOR MEDICAL PURPOSES ONLY   Salicylate level     Status: Abnormal   Collection Time:  01/05/14  7:24 AM  Result Value Ref Range   Salicylate Lvl <2.6 (L) 2.8 - 20.0 mg/dL  Urine Drug Screen     Status: Abnormal   Collection Time: 01/05/14 10:37 AM  Result Value Ref Range   Opiates NONE DETECTED NONE DETECTED   Cocaine NONE DETECTED NONE DETECTED   Benzodiazepines NONE DETECTED NONE DETECTED   Amphetamines NONE DETECTED NONE DETECTED   Tetrahydrocannabinol POSITIVE (A) NONE DETECTED   Barbiturates NONE DETECTED NONE DETECTED    Comment:        DRUG SCREEN FOR MEDICAL PURPOSES ONLY.  IF CONFIRMATION IS NEEDED FOR ANY PURPOSE, NOTIFY LAB WITHIN 5 DAYS.        LOWEST DETECTABLE LIMITS FOR URINE DRUG SCREEN Drug Class       Cutoff (ng/mL) Amphetamine      1000 Barbiturate      200 Benzodiazepine   712 Tricyclics       458 Opiates          300 Cocaine          300 THC              50   Ethanol     Status: None   Collection Time: 01/06/14 12:50 PM  Result Value Ref Range   Alcohol, Ethyl (B) <11 0 - 11 mg/dL    Comment:        LOWEST DETECTABLE LIMIT FOR SERUM ALCOHOL IS 11 mg/dL FOR MEDICAL PURPOSES ONLY    Psychological Evaluations:  Assessment:   Patient is a 53 year old man, with a long history of alcohol dependence, who was found grossly intoxicated on 11/8. Patient admits to heavy , daily drinking and in reviewing chart notes has had multiple recent visits to ED due to alcohol intoxication and related issues. He has been blacking out often, and has little memory of what happened before being brought to hospital. He does have chronic stressors, to include homelessness, poor sober support network, and disability. He reports depression, sadness, but at this time is not suicidal or psychotic. At this time not presenting with significant withdrawal symptoms, minimal distal tremors.  DSM5:    AXIS I: Alcohol Dependence, Alcohol Withdrawal, Depression NOS, consider Alcohol Induced  AXIS II:  Deferred AXIS III:   Past Medical History  Diagnosis Date  .  Hypertension   .  Degenerative disc disease   . Depression    AXIS IV:  economic problems, housing problems and occupational problems AXIS V:  41-50 serious symptoms  Treatment Plan/Recommendations:  See below  Treatment Plan Summary: Daily contact with patient to assess and evaluate symptoms and progress in treatment Medication management See below Current Medications:  Current Facility-Administered Medications  Medication Dose Route Frequency Provider Last Rate Last Dose  . acetaminophen (TYLENOL) tablet 650 mg  650 mg Oral Q6H PRN Laverle Hobby, PA-C      . alum & mag hydroxide-simeth (MAALOX/MYLANTA) 200-200-20 MG/5ML suspension 30 mL  30 mL Oral Q4H PRN Laverle Hobby, PA-C      . amLODipine (NORVASC) tablet 5 mg  5 mg Oral Daily Elmarie Shiley, NP      . hydrOXYzine (ATARAX/VISTARIL) tablet 25 mg  25 mg Oral Q6H PRN Laverle Hobby, PA-C      . loperamide (IMODIUM) capsule 2-4 mg  2-4 mg Oral PRN Laverle Hobby, PA-C      . LORazepam (ATIVAN) tablet 1 mg  1 mg Oral Q6H PRN Laverle Hobby, PA-C   1 mg at 01/07/14 8088  . LORazepam (ATIVAN) tablet 1 mg  1 mg Oral QID Laverle Hobby, PA-C   1 mg at 01/07/14 1336   Followed by  . [START ON 01/08/2014] LORazepam (ATIVAN) tablet 1 mg  1 mg Oral TID Laverle Hobby, PA-C       Followed by  . [START ON 01/09/2014] LORazepam (ATIVAN) tablet 1 mg  1 mg Oral BID Laverle Hobby, PA-C       Followed by  . [START ON 01/11/2014] LORazepam (ATIVAN) tablet 1 mg  1 mg Oral Daily Spencer E Simon, PA-C      . magnesium hydroxide (MILK OF MAGNESIA) suspension 30 mL  30 mL Oral Daily PRN Laverle Hobby, PA-C      . multivitamin with minerals tablet 1 tablet  1 tablet Oral Daily Laverle Hobby, PA-C   1 tablet at 01/07/14 0854  . ondansetron (ZOFRAN-ODT) disintegrating tablet 4 mg  4 mg Oral Q6H PRN Laverle Hobby, PA-C      . thiamine (B-1) injection 100 mg  100 mg Intramuscular Once Laverle Hobby, PA-C   100 mg at 01/06/14 2200  . thiamine  (VITAMIN B-1) tablet 100 mg  100 mg Oral Daily Laverle Hobby, PA-C   100 mg at 01/07/14 0854  . traZODone (DESYREL) tablet 50 mg  50 mg Oral QHS,MR X 1 Spencer E Simon, PA-C   50 mg at 01/06/14 2200    Observation Level/Precautions:  15 minute checks  Laboratory:  repeat BMP  Psychotherapy:  Supportive, group therapy  Medications:  Continue Ativan taper to address ETOH detox. Start Zoloft 50 mgrs QDAY   Consultations:  If needed   Discharge Concerns:  Homelessness, poor support system  Estimated LOS: 5 days  Other:     I certify that inpatient services furnished can reasonably be expected to improve the patient's condition.   Neeva Trew 11/10/20153:17 PM

## 2014-01-07 NOTE — Progress Notes (Signed)
Patient ID: Richard BrownsJames Stockinger, male   DOB: 01/24/1961, 53 y.o.   MRN: [id number] He was in bed Most of day . After he got up he showered and has been in day room has attended group this after noon. He thinks that he is being discharged and has tried to go out the door. He is cooperative and calm when redirected. Self inventory: depression 9. Hopelessness 9. Anxiety 7 Positive SI thoughts contracts for safety.  Denies pain. Goal is to feeling depressed and go to groups.

## 2014-01-07 NOTE — Tx Team (Signed)
Interdisciplinary Treatment Plan Update (Adult) Date: 01/07/2014   Time Reviewed: 9:30 AM  Progress in Treatment: Attending groups: Continuing to assess, patient new to milieu Participating in groups: Continuing to assess, patient new to milieu Taking medication as prescribed: Yes Tolerating medication: Yes Family/Significant other contact made: No, CSW continuing to assess for appropriate collateral contact Patient understands diagnosis: Yes Discussing patient identified problems/goals with staff: Yes Medical problems stabilized or resolved: Yes Denies suicidal/homicidal ideation: Continuing to assess Issues/concerns per patient self-inventory: Yes Other:  New problem(s) identified: N/A  Discharge Plan or Barriers: CSW continuing to assess, patient new to milieu  Reason for Continuation of Hospitalization:  Depression Anxiety Medication Stabilization   Comments: N/A  Estimated length of stay: 3-5 days  For review of initial/current patient goals, please see plan of care. "Hoover BrownsJames Hochstetler is an 53 y.o. Male.[facility] after EMS found pt intoxicated at a laundromat refusing to leave the premises. Pt too intoxicated to walk, so EMS called per ED notes. Pt reports he has been drinking varying amounts of "rot gut wine," Listerine and liquor. Pt has been drinking daily and reports last drank last night - 2 bottles of wine and unknown amount of Listerine. He also admits to using marijuana once weekly. Pt stated he needs help with alcohol detox. Pt stated current stressors are getting evicted from his apartment for associating with "the wrong people," financial stress, medical and having no support system. Pt denies SI, HI or AVH and no delusions noted. Pt reports current withdrawal sx are tremors and headache from alcohol. He denies hx of seizures. Pt calm, cooperative, oriented x 3, had good eye contact, soft and slurred speech, depressed mood and appropriate  affect. Pt has had both inpatient and outpatient treatment for SA and depression in the past by report. [facility], but cannot recall previous dates of inpatient and outpatient providers he has had in the past."  Attendees: Patient:    Family:    Physician: Dr. Jama Flavorsobos; Dr. Dub MikesLugo 01/07/2014 9:30 AM  Nursing: Andris FlurryBrittany Guthre, Donna Shimp, RN 01/07/2014 9:30 AM  Clinical Social Worker: Samuella BruinKristin Alexiss Iturralde,  LCSWA 01/07/2014 9:30 AM  Other: Juline PatchQuylle Hodnett, LCSW 01/07/2014 9:30 AM  Other: Leisa LenzValerie Enoch, Vesta [facility] Liaison 01/07/2014 9:30 AM  Other: Onnie BoerJennifer Clark, Case Manager 01/07/2014 9:30 AM  Other:  01/07/2014 9:30 AM  Other:    Other:    Other:    Other:    Other:      Scribe for Treatment Team:  Samuella BruinKristin Boysie Bonebrake, MSW, [employer]CSWA [phone]

## 2014-01-07 NOTE — [facility] Suicide Risk Assessment (Signed)
   Nursing information obtained from:  Patient Demographic factors:  Male, Divorced or widowed, Caucasian, Low socioeconomic status, Living alone Current Mental Status:  NA Loss Factors:  Loss of significant relationship, Financial problems / change in socioeconomic status Historical Factors:  NA Risk Reduction Factors:  NA Total Time spent with patient: 45 minutes  CLINICAL FACTORS:  Alcohol Dependence, Depression  Psychiatric Specialty Exam: Physical Exam  ROS  Blood pressure 150/101, pulse 78, temperature 97.9 F (36.6 C), temperature source Oral, resp. rate 18.There is no weight on file to calculate BMI.  SEE ADMIT NOTE MSE   COGNITIVE FEATURES THAT CONTRIBUTE TO RISK:  Closed-mindedness    SUICIDE RISK:   Moderate:  Frequent suicidal ideation with limited intensity, and duration, some specificity in terms of plans, no associated intent, good self-control, limited dysphoria/symptomatology, some risk factors present, and identifiable protective factors, including available and accessible social support.  PLAN OF CARE: Patient will be admitted to inpatient psychiatric unit for stabilization and safety. Will provide and encourage milieu participation. Provide medication management and maked adjustments as needed. Will also provide medication management to minimize risk of alcohol withdrawal. Will follow daily.    I certify that inpatient services furnished can reasonably be expected to improve the patient's condition.  Traye Bates 01/07/2014, 3:43 PM

## 2014-01-07 NOTE — Progress Notes (Signed)
D. Pt was up and visible in milieu this evening, pt did not attend or participate in evening group activity. Pt spoke about how he has been drinking and how he had gone through detox numerous times in the past. Pt is actively tremulous, sweaty and reports anxiety and nausea. Pt also is disheveled and malodorous this evening and appears unsteady. Pt did receive medications as well as food and fluids before retiring to his room. A. Support and encouragement provided. R. Safety maintained, will continue to monitor.

## 2014-01-08 DIAGNOSIS — F1019 Alcohol abuse with unspecified alcohol-induced disorder: Secondary | ICD-10-CM

## 2014-01-08 LAB — URINALYSIS, ROUTINE W REFLEX MICROSCOPIC
BILIRUBIN URINE: NEGATIVE
Bilirubin Urine: NEGATIVE
GLUCOSE, UA: NEGATIVE mg/dL
Glucose, UA: NEGATIVE mg/dL
Hgb urine dipstick: NEGATIVE
Ketones, ur: NEGATIVE mg/dL
Ketones, ur: NEGATIVE mg/dL
LEUKOCYTES UA: NEGATIVE
Leukocytes, UA: NEGATIVE
NITRITE: NEGATIVE
Nitrite: NEGATIVE
PROTEIN: NEGATIVE mg/dL
PROTEIN: NEGATIVE mg/dL
Specific Gravity, Urine: 1.009 (ref 1.005–1.030)
Specific Gravity, Urine: 1.021 (ref 1.005–1.030)
UROBILINOGEN UA: 1 mg/dL (ref 0.0–1.0)
UROBILINOGEN UA: 2 mg/dL — AB (ref 0.0–1.0)
pH: 7 (ref 5.0–8.0)
pH: 7 (ref 5.0–8.0)

## 2014-01-08 LAB — BASIC METABOLIC PANEL
Anion gap: 12 (ref 5–15)
BUN: 7 mg/dL (ref 6–23)
CO2: 23 meq/L (ref 19–32)
Calcium: 9.3 mg/dL (ref 8.4–10.5)
Chloride: 104 mEq/L (ref 96–112)
Creatinine, Ser: 0.51 mg/dL (ref 0.50–1.35)
GFR calc Af Amer: >90 mL/min (ref >90)
GFR calc non Af Amer: >90 mL/min (ref >90)
GLUCOSE: 85 mg/dL (ref 70–99)
POTASSIUM: 4.1 meq/L (ref 3.7–5.3)
SODIUM: 139 meq/L (ref 137–147)

## 2014-01-08 LAB — URINE MICROSCOPIC-ADD ON

## 2014-01-08 MED ORDER — AMLODIPINE BESYLATE 10 MG PO TABS
10.0000 mg | ORAL_TABLET | Freq: Every day | ORAL | Status: DC
  Administered 2014-01-08 – 2014-01-15 (×8): 10 mg via ORAL
  Filled 2014-01-08 (×6): qty 1
  Filled 2014-01-08: qty 2
  Filled 2014-01-08 (×2): qty 1
  Filled 2014-01-08: qty 2
  Filled 2014-01-08 (×2): qty 1

## 2014-01-08 MED ORDER — ACAMPROSATE CALCIUM 333 MG PO TBEC
666.0000 mg | DELAYED_RELEASE_TABLET | Freq: Three times a day (TID) | ORAL | Status: DC
  Administered 2014-01-08 – 2014-01-15 (×21): 666 mg via ORAL
  Filled 2014-01-08 (×11): qty 2
  Filled 2014-01-08: qty 1
  Filled 2014-01-08 (×15): qty 2

## 2014-01-08 MED ORDER — TRAZODONE HCL 100 MG PO TABS
100.0000 mg | ORAL_TABLET | Freq: Every evening | ORAL | Status: DC | PRN
Start: 2014-01-08 — End: 2014-01-10

## 2014-01-08 MED ORDER — HYDRALAZINE HCL 25 MG PO TABS
25.0000 mg | ORAL_TABLET | Freq: Three times a day (TID) | ORAL | Status: DC
  Administered 2014-01-08 – 2014-01-15 (×18): 25 mg via ORAL
  Filled 2014-01-08 (×30): qty 1

## 2014-01-08 NOTE — Progress Notes (Signed)
[facility] MD Progress Note  01/08/2014 1:32 PM Richard Dunn  MRN:  [id number] Subjective:  Patient states he is " not feeling well".  He reports feeling vaguely weak and feels tremulous and in some withdrawal.  He also endorses depression. Objective: I have discussed case with treatment team and met with patient. Patient remains dysphoric, depressed, sad.  Vitals are remarkable for HTN ( patient does have a documented history of HTN and is on Norvasc). He is not tachycardic. He has mild distal tremors, he is not diaphoretic or flushed. He has been going to some groups, although his participation has been limited, and staff has noted he presents depressed and flat in affect. No disruptive behaviors on unit. No medication side effects. Reports that cravings for alcohol are a contributor to his relapses, and expressed interest in Campral trial to address ETOH cravings.   Diagnosis:  Alcohol Dependence, Alcohol Withdrawal, Depression NOS, consider Alcohol Induced  Total Time spent with patient: 25 minutes     ADL's:  fair  Sleep:poor   Appetite: fair   Suicidal Ideation:  Denies any current SI Homicidal Ideation:  Denies  AEB (as evidenced by):  Psychiatric Specialty Exam: Physical Exam  Review of Systems  Constitutional: Positive for malaise/fatigue. Negative for fever and chills.  Respiratory: Positive for cough. Negative for shortness of breath.   Cardiovascular: Negative for chest pain.  Gastrointestinal: Positive for nausea. Negative for vomiting and blood in stool.  Skin: Negative for rash.  Neurological: Negative for seizures and headaches.  Psychiatric/Behavioral: Positive for depression and substance abuse.    Blood pressure 151/97, pulse 78, temperature 97.7 F (36.5 C), temperature source Oral, resp. rate 16.There is no weight on file to calculate BMI.  General Appearance: Fairly Groomed  Engineer, water::  Good  Speech:  Normal Rate  Volume:  Decreased  Mood:  Depressed   Affect:  Constricted  Thought Process:  Goal Directed and Linear  Orientation:  Other:  fully alert and attentive  Thought Content:  denies hallucinations,no delusions  Suicidal Thoughts:  No- at present denies any current SI and contracts for safety on the unit  Homicidal Thoughts:  No  Memory:  recent and remote grossly intact  Judgement:  Fair  Insight:  Fair  Psychomotor Activity:  Decreased  Concentration:  Fair  Recall:  Good  Fund of Knowledge:Good  Language: Good  Akathisia:  Negative  Handed:  Right  AIMS (if indicated):     Assets:  Desire for Improvement Resilience  Sleep:  Number of Hours: 0.75   Musculoskeletal: Strength & Muscle Tone: within normal limits Gait & Station: normal- walks slowly Patient leans: N/A  Current Medications: Current Facility-Administered Medications  Medication Dose Route Frequency Provider Last Rate Last Dose  . acetaminophen (TYLENOL) tablet 650 mg  650 mg Oral Q6H PRN Laverle Hobby, PA-C   650 mg at 01/08/14 0844  . alum & mag hydroxide-simeth (MAALOX/MYLANTA) 200-200-20 MG/5ML suspension 30 mL  30 mL Oral Q4H PRN Laverle Hobby, PA-C      . amLODipine (NORVASC) tablet 5 mg  5 mg Oral Daily Elmarie Shiley, NP   5 mg at 01/08/14 0803  . hydrOXYzine (ATARAX/VISTARIL) tablet 25 mg  25 mg Oral Q6H PRN Laverle Hobby, PA-C   25 mg at 01/08/14 0844  . loperamide (IMODIUM) capsule 2-4 mg  2-4 mg Oral PRN Laverle Hobby, PA-C      . LORazepam (ATIVAN) tablet 1 mg  1 mg Oral Q6H PRN Frederico Hamman  E Simon, PA-C   1 mg at 01/07/14 3382  . LORazepam (ATIVAN) tablet 1 mg  1 mg Oral TID Laverle Hobby, PA-C   1 mg at 01/08/14 1207   Followed by  . [START ON 01/09/2014] LORazepam (ATIVAN) tablet 1 mg  1 mg Oral BID Laverle Hobby, PA-C       Followed by  . [START ON 01/11/2014] LORazepam (ATIVAN) tablet 1 mg  1 mg Oral Daily Spencer E Simon, PA-C      . magnesium hydroxide (MILK OF MAGNESIA) suspension 30 mL  30 mL Oral Daily PRN Laverle Hobby, PA-C       . multivitamin with minerals tablet 1 tablet  1 tablet Oral Daily Laverle Hobby, PA-C   1 tablet at 01/08/14 0804  . nicotine polacrilex (NICORETTE) gum 2 mg  2 mg Oral PRN Jenne Campus, MD      . ondansetron (ZOFRAN-ODT) disintegrating tablet 4 mg  4 mg Oral Q6H PRN Laverle Hobby, PA-C      . sertraline (ZOLOFT) tablet 50 mg  50 mg Oral Daily Jenne Campus, MD   50 mg at 01/08/14 0804  . thiamine (B-1) injection 100 mg  100 mg Intramuscular Once Laverle Hobby, PA-C   100 mg at 01/06/14 2200  . thiamine (VITAMIN B-1) tablet 100 mg  100 mg Oral Daily Laverle Hobby, PA-C   100 mg at 01/08/14 5053  . traZODone (DESYREL) tablet 50 mg  50 mg Oral QHS,MR X 1 Laverle Hobby, PA-C   50 mg at 01/08/14 9767    Lab Results:  Results for orders placed or performed during the hospital encounter of 01/06/14 (from the past 48 hour(s))  Urinalysis, Routine w reflex microscopic     Status: Abnormal   Collection Time: 01/07/14  8:05 PM  Result Value Ref Range   Color, Urine YELLOW YELLOW   APPearance CLEAR CLEAR   Specific Gravity, Urine 1.009 1.005 - 1.030   pH 7.0 5.0 - 8.0   Glucose, UA NEGATIVE NEGATIVE mg/dL   Hgb urine dipstick LARGE (A) NEGATIVE   Bilirubin Urine NEGATIVE NEGATIVE   Ketones, ur NEGATIVE NEGATIVE mg/dL   Protein, ur NEGATIVE NEGATIVE mg/dL   Urobilinogen, UA 1.0 0.0 - 1.0 mg/dL   Nitrite NEGATIVE NEGATIVE   Leukocytes, UA NEGATIVE NEGATIVE    Comment: Performed at [facility]  Urine microscopic-add on     Status: None   Collection Time: 01/07/14  8:05 PM  Result Value Ref Range   Squamous Epithelial / LPF RARE RARE   WBC, UA 0-2 <3 WBC/hpf   RBC / HPF 11-20 <3 RBC/hpf   Bacteria, UA RARE RARE    Comment: Performed at [facility] metabolic panel     Status: None   Collection Time: 01/08/14  6:21 AM  Result Value Ref Range   Sodium 139 137 - 147 mEq/L   Potassium 4.1 3.7 - 5.3 mEq/L   Chloride 104 96 - 112  mEq/L   CO2 23 19 - 32 mEq/L   Glucose, Bld 85 70 - 99 mg/dL   BUN 7 6 - 23 mg/dL   Creatinine, Ser 0.51 0.50 - 1.35 mg/dL   Calcium 9.3 8.4 - 10.5 mg/dL   GFR calc non Af Amer >90 >90 mL/min   GFR calc Af Amer >90 >90 mL/min    Comment: (NOTE) The eGFR has been calculated using the CKD EPI equation.  This calculation has not been validated in all clinical situations. eGFR's persistently <90 mL/min signify possible Chronic Kidney Disease.    Anion gap 12 5 - 15    Comment: Performed at [facility]    Physical Findings: AIMS: Facial and Oral Movements Muscles of Facial Expression: None, normal Lips and Perioral Area: None, normal Jaw: None, normal Tongue: None, normal,Extremity Movements Upper (arms, wrists, hands, fingers): Minimal Lower (legs, knees, ankles, toes): None, normal, Trunk Movements Neck, shoulders, hips: None, normal, Overall Severity Severity of abnormal movements (highest score from questions above): Minimal Incapacitation due to abnormal movements: Minimal Patient's awareness of abnormal movements (rate only patient's report): Aware, no distress, Dental Status Current problems with teeth and/or dentures?: Yes Does patient usually wear dentures?: Yes  CIWA:  CIWA-Ar Total: 9 COWS:     ASSESSMENT- patient remains depressed, sad, but not actively suicidal. Some withdrawal symptoms, but suspect that elevated BP is at least partially related to underlying HTN. Concerned about hematuria.  Tolerating detox and Zoloft well thus far. Treatment Plan Summary: Daily contact with patient to assess and evaluate symptoms and progress in treatment Medication management See below  Plan: Continue inpatient treatment, milieu, support Continue Ativan ETOH detox protocol Continue Zoloft 50 mgrs QDAY  Increase Trazodone to 100 mgrs QHS to address ongoing insomnia Start Campral 666 mgrs TID for alcohol cravings Request Hospitalist Consultation   Medical  Decision Making Problem Points:  Established problem, stable/improving (1), Review of last therapy session (1) and Review of psycho-social stressors (1) Data Points:  Review or order clinical lab tests (1) Review of medication regiment & side effects (2) Review of new medications or change in dosage (2)  I certify that inpatient services furnished can reasonably be expected to improve the patient's condition.   COBOS, FERNANDO 01/08/2014, 1:32 PM

## 2014-01-08 NOTE — Progress Notes (Signed)
As of this time, pt remains awake in room. Pt has been unable to get to sleep this evening thus far. Pt reports that he has been having a hard time trying to get to sleep but is not requesting anything at this time.

## 2014-01-08 NOTE — Progress Notes (Signed)
Pt's goal today was to continue with his sobriety by going to groups, talking with the doctor, nurse and social worker which Pt has only been to one group this morning. The rest of the day he has been in bed.  He rated all his depression, hopelessness and anxiety a 8 on his self-inventory.  He denies any S/H ideation or A/V/H. Pt's CIWA has been 9 at noon and 8 at 1700. His blood pressure has been high much of the day even after taking his medication. He received a increase of his Norvasc to 10 mg at 1449 (he received 5 mg this morning at 0803). The hospitalist Dr. Blake DivineAkula started pt on hydralazine 25 mg every 8 hours.

## 2014-01-08 NOTE — Progress Notes (Signed)
Adult Psychoeducational Group Note  Date:  01/08/2014 Time:  11:28 PM  Group Topic/Focus:  Narcotics Anonymous  Participation Level:  Did Not Attend  Additional Comments:  Pt did not attend group.  Berlin Hunuttle, Michelle Vanhise M 01/08/2014, 11:28 PM

## 2014-01-08 NOTE — Plan of Care (Signed)
Problem: Diagnosis: Increased Risk For Suicide Attempt Goal: STG-Patient Will Report Suicidal Feelings to Staff Outcome: Progressing Pt is denying any suicidal ideations and does contract verbally to come to staff before acting on any self-harms thoughts.

## 2014-01-08 NOTE — [facility] Group Notes (Signed)
[facility] LCSW Group Therapy 01/08/2014  1:15 PM   Type of Therapy: Group Therapy  Participation Level: Did Not Attend- patient in bed.   Samuella BruinKristin Ethelyne Erich, MSW, [employer] Clinical Social Worker [facility] [phone]

## 2014-01-08 NOTE — Progress Notes (Signed)
D. Pt has been up and has been visible in milieu this evening, has attended and participated in evening group activity. Pt is actively withdrawing, is tremulous, sweaty and anxious. Pt also appears flat and depressed in the milieu. Pt has been unable to get to sleep yet this evening and did receive medications without incident to assist with sleep. A. Support and encouragement provided. R. Safety maintained, will continue to monitor.

## 2014-01-08 NOTE — [facility] Group Notes (Signed)
   [facility] Note  01/08/2014  8:45 AM   Participation Quality: Alert, Appropriate and Oriented  Mood/Affect: Depressed and Flat  Depression Rating: 8  Anxiety Rating: 8  Thoughts of Suicide: Pt denies SI/HI  Will you contract for safety? Yes  Current AVH: Pt denies  Plan for Discharge/Comments: Pt attended discharge planning group and actively participated in group. CSW provided pt with today's workbook. Patient plans to return to previous living arrangement to follow up with an agency [address] [place], though he is unable to recall the name of agency at this time. Patient verbalized his concerns regarding his high blood pressure.  Transportation Means: Pt reports access to transportation via the bus system  Supports: No supports mentioned at this time  Samuella BruinKristin Codey Burling, MSW, [employer]CSWA Clinical Social Worker [facility] [phone]

## 2014-01-08 NOTE — Consult Note (Signed)
[facility] Medical Consultation  Richard BrownsJames Dunn ZOX:[id number] DOB: 12/14/1960 DOA: 01/06/2014 PCP: No primary care provider on file.   Requesting physician: Dr Margie Billetobbs Date of consultation: 11/11 Reason for consultation: hypertension.  Impression/Recommendations Active Problems:   Alcohol abuse with alcohol-induced disorder    1. Accelerated hypertension: Start the patient on hydralazine 25 mg  Every 8 hours and increase norvasc to 10 mg daily.  2. Hematuria: one episode and as per the patient , the urine cleared. Repeat UA , if persistent hematuria, will need urology consult. He reports some back discomfort, will get a CT abdomen and pelvis without contrast to evaluate for renal stones. His renal function appears to be stable.  3. Back pain: see above 4. Alcohol abuse; currently in withdrawal . Further management as per psychiatry 5. Transaminitis.: possibly from alcohol abuse  I will followup again tomorrow. Please contact me if I can be of assistance in the meanwhile. Thank you for this consultation.  Chief Complaint: alcohol intoxication  HPI:  53 year old gentlemanwith h/o hypertension, alcohol abuse [facility]. His bp was found to be elevated and medical service was consulted. His HR appears to be within normal limits. He denies any chest pain, sob or pedal edema. He reports missing his medications all the time.   Review of Systems:  Please see HPI for pertinent positives and rest of them are negative.   Past Medical History  Diagnosis Date  . Hypertension   . Degenerative disc disease   . Depression    Past Surgical History  Procedure Laterality Date  . Cervical spine surgery     Social History:  reports that he has been smoking Cigarettes.  He has been smoking about 1.50 packs per day. He does not have any smokeless tobacco history on file. He reports that he drinks alcohol. He reports that he uses illicit drugs  (Marijuana).  Allergies  Allergen Reactions  . Lisinopril Swelling    (Angioedema)  . Other     Mycin Drugs  . Sulfonamide Derivatives Other (See Comments)    Childhood allergy   History reviewed. No pertinent family history.  Prior to Admission medications   Not on File   Physical Exam: Blood pressure 151/97, pulse 78, temperature 97.7 F (36.5 C), temperature source Oral, resp. rate 16. Filed Vitals:   01/08/14 1132  BP: 151/97  Pulse: 78  Temp:   Resp: 16     General:  Alert afebrile comfortable  Eyes: pupils reacting to light and accomadation  Neck: no lymphadenopathy  Cardiovascular: s1s2, no M/R/G  Respiratory: chest clear to ausculation  Abdomen: soft non tender non distended bowel sounds heard  Skin: no rash  Musculoskeletal: slight soreness in the back, no pedal edema  Neurologic: alert and oriented to place person and time. No focal deficits.   Labs on Admission:  Basic Metabolic Panel:  Recent Labs Lab 01/05/14 0724 01/08/14 0621  NA 148* 139  K 3.4* 4.1  CL 105 104  CO2 28 23  GLUCOSE 86 85  BUN 4* 7  CREATININE 0.46* 0.51  CALCIUM 7.7* 9.3   Liver Function Tests:  Recent Labs Lab 01/05/14 0724  AST 166*  ALT 71*  ALKPHOS 224*  BILITOT 0.3  PROT 6.7  ALBUMIN 2.6*   No results for input(s): LIPASE, AMYLASE in the last 168 hours. No results for input(s): AMMONIA in the last 168 hours. CBC:  Recent Labs Lab 01/05/14 0724  WBC 4.4  HGB 13.8  HCT 40.5  MCV 101.3*  PLT 197   Cardiac Enzymes: No results for input(s): CKTOTAL, CKMB, CKMBINDEX, TROPONINI in the last 168 hours. BNP: Invalid input(s): POCBNP CBG: No results for input(s): GLUCAP in the last 168 hours.  Radiological Exams on Admission: No results found.  EKG: not done this admission  Time spent: 45 min  Hartleigh Edmonston [facility] Pager [phone]  If 7PM-7AM, please contact night-coverage [email or web address] Password [email or web address] 01/08/2014, 2:00  PM

## 2014-01-09 LAB — BASIC METABOLIC PANEL
ANION GAP: 14 (ref 5–15)
BUN: 8 mg/dL (ref 6–23)
CO2: 21 mEq/L (ref 19–32)
CREATININE: 0.58 mg/dL (ref 0.50–1.35)
Calcium: 9.6 mg/dL (ref 8.4–10.5)
Chloride: 99 mEq/L (ref 96–112)
Glucose, Bld: 95 mg/dL (ref 70–99)
Potassium: 4.5 mEq/L (ref 3.7–5.3)
Sodium: 134 mEq/L — ABNORMAL LOW (ref 137–147)

## 2014-01-09 NOTE — Progress Notes (Signed)
D: Pt presents flat in affect and depressed in mood this evening. Pt verbally endorses depression. Pt was not present within the milieu this evening. Pt had an elevated BP this evening. Pt denied having any chest pain, headaches, blurred vision, and/or SOB. Pt was informed that the hospitalist ordered a CT scan to rule out any kidney stones. Pt reported no additional sightings of hematuria this evening. Pt is currently denying any SI/HI/AVH.  A: Writer administered scheduled medications to pt. Continued support and availability as needed was extended to this pt. Staff continue to monitor pt with q8015min checks.  R: No adverse drug reactions noted. BP decreased after the administration of hydralazine 25 mg. Pt receptive to treatment. Pt remains safe at this time.   Pt reported having confusion when questioned about any active withdrawal symptoms. Pt was disoriented to time. Pt reported this Wednesday as Sunday. Writer informed pt of the current day.

## 2014-01-09 NOTE — [facility] Group Notes (Signed)
[facility] LCSW Group Therapy 01/09/2014  1:15 pm   Type of Therapy: Group Therapy Participation Level: Active  Participation Quality: Attentive, Sharing and Supportive  Affect: Depressed and Flat  Cognitive: Alert and Oriented  Insight: Developing/Improving and Engaged  Engagement in Therapy: Developing/Improving and Engaged  Modes of Intervention: Clarification, Confrontation, Discussion, Education, Exploration, Limit-setting, Orientation, Problem-solving, Rapport Building, Dance movement psychotherapisteality Testing, Socialization and Support  Summary of Progress/Problems: The topic for group was balance in life. Today's group focused on defining balance in one's own words, identifying things that can knock one off balance, and exploring healthy ways to maintain balance in life. Group members were asked to provide an example of a time when they felt off balance, describe how they handled that situation,and process healthier ways to regain balance in the future. Group members were asked to share the most important tool for maintaining balance that they [facility] and how they plan to apply this method after discharge. Patient reports that he would like to focus on his sobriety. He plans to do this by re-engaging in AA/NA. CSW's provided patient with emotional support and encouragement. Patient left group a few minutes early due to feeling tired.  Samuella BruinKristin Gurshaan Matsuoka, MSW, [employer] Clinical Social Worker [facility] [phone]

## 2014-01-09 NOTE — Plan of Care (Signed)
Problem: Ineffective individual coping Goal: STG: Patient will remain free from self harm Outcome: Progressing 15 minute checks completed per protocol for pt safety. Pt denies desires to harm self.  Problem: Alteration in mood & ability to function due to Goal: LTG-Pt reports reduction in suicidal thoughts (Patient reports reduction in suicidal thoughts and is able to verbalize a safety plan for whenever patient is feeling suicidal)  Outcome: Progressing Patient denies SI today. Goal: STG-Patient will report withdrawal symptoms Outcome: Progressing Pt reports withdrawal symptoms including tremors, anxiety, and nausea today.  Problem: Diagnosis: Increased Risk For Suicide Attempt Goal: STG-Patient Will Attend All Groups On The Unit Outcome: Not Progressing Pt remains in bed the majority of the day today. Will continue to encourage pt to attend groups.

## 2014-01-09 NOTE — [facility] Group Notes (Signed)
[facility] Group Notes:  (Nursing/MHT/Case Management/Adjunct)  Date:  01/09/2014  Time:  2:04 PM  Type of Therapy:  Nurse Education  Participation Level:  Did Not Attend  Participation Quality:  N/A  Affect:  N/A  Cognitive:  Did not attend  Insight:  None  Engagement in Group:  None  Modes of Intervention:  Education and Support  Summary of Progress/Problems: Pt did not attend group and remained in bed d/t nausea.  Richard Dunn, Lindaann Gradilla A 01/09/2014, 2:04 PM

## 2014-01-09 NOTE — Progress Notes (Signed)
Patient ID: Richard Dunn, male   DOB: 1960-10-06, 53 y.o.   MRN: [id number] [facility] MD Progress Note  01/09/2014 4:29 PM Richard Dunn  MRN:  [id number] Subjective:  Patient continues to report he feels vaguely ill, nauseous, with low energy. Objective:Have discussed case with treatment team and have met with patient. Also appreciate Hospitalist involvement and follow up. Patient is hypertensive and medications are being adjusted to better control his BP. He is endorsing ongoing subjective sense of alcohol withdrawal, such as feeling anxious, jittery and nauseous. He does appear better than yesterday, with better eye contact, more conversant. His milieu participation has been limited, but is improving. He is depressed, but is denying any suicidal ideations at  This time. He is tolerating medications well. Labs reviewed - Repeat UA negative for Hgb   Diagnosis:  Alcohol Dependence, Alcohol Withdrawal, Depression NOS, consider Alcohol Induced  Total Time spent with patient: 25 minutes     ADL's:  fair  Sleep: fair, but improved    Appetite: fair   Suicidal Ideation:  Denies any current SI Homicidal Ideation:  Denies  AEB (as evidenced by):  Psychiatric Specialty Exam: Physical Exam  Review of Systems  Constitutional: Positive for malaise/fatigue. Negative for fever and chills.  Respiratory: Positive for cough. Negative for shortness of breath.   Cardiovascular: Negative for chest pain.  Gastrointestinal: Positive for nausea. Negative for vomiting and blood in stool.  Skin: Negative for rash.  Neurological: Negative for seizures and headaches.  Psychiatric/Behavioral: Positive for depression and substance abuse.    Blood pressure 122/90, pulse 95, temperature 97.9 F (36.6 C), temperature source Oral, resp. rate 16.There is no weight on file to calculate BMI.  General Appearance: Fairly Groomed  Engineer, water::  Good  Speech:  Normal Rate  Volume:  improved speech/volume  Mood:   Depressed  Affect:  Constricted, a little more reactive today  Thought Process:  Goal Directed and Linear  Orientation:  Other:  fully alert and attentive  Thought Content:  denies hallucinations,no delusions  Suicidal Thoughts:  No- at present denies any current SI and contracts for safety on the unit  Homicidal Thoughts:  No  Memory:  recent and remote grossly intact  Judgement:  Fair  Insight:  Fair  Psychomotor Activity:  Decreased  Concentration:  Fair  Recall:  Good  Fund of Knowledge:Good  Language: Good  Akathisia:  Negative  Handed:  Right  AIMS (if indicated):     Assets:  Desire for Improvement Resilience  Sleep:  Number of Hours: 4.5   Musculoskeletal: Strength & Muscle Tone: within normal limits Gait & Station: normal- walks slowly Patient leans: N/A  Current Medications: Current Facility-Administered Medications  Medication Dose Route Frequency Provider Last Rate Last Dose  . acamprosate (CAMPRAL) tablet 666 mg  666 mg Oral TID WC Jenne Campus, MD   666 mg at 01/09/14 1219  . acetaminophen (TYLENOL) tablet 650 mg  650 mg Oral Q6H PRN Laverle Hobby, PA-C   650 mg at 01/08/14 0844  . alum & mag hydroxide-simeth (MAALOX/MYLANTA) 200-200-20 MG/5ML suspension 30 mL  30 mL Oral Q4H PRN Laverle Hobby, PA-C      . amLODipine (NORVASC) tablet 10 mg  10 mg Oral Daily Hosie Poisson, MD   10 mg at 01/09/14 0857  . hydrALAZINE (APRESOLINE) tablet 25 mg  25 mg Oral 3 times per day Hosie Poisson, MD   25 mg at 01/09/14 1328  . hydrOXYzine (ATARAX/VISTARIL) tablet 25 mg  25  mg Oral Q6H PRN Laverle Hobby, PA-C   25 mg at 01/08/14 0844  . loperamide (IMODIUM) capsule 2-4 mg  2-4 mg Oral PRN Laverle Hobby, PA-C      . LORazepam (ATIVAN) tablet 1 mg  1 mg Oral Q6H PRN Laverle Hobby, PA-C   1 mg at 01/07/14 6270  . LORazepam (ATIVAN) tablet 1 mg  1 mg Oral BID Laverle Hobby, PA-C       Followed by  . [START ON 01/11/2014] LORazepam (ATIVAN) tablet 1 mg  1 mg Oral Daily  Spencer E Simon, PA-C      . magnesium hydroxide (MILK OF MAGNESIA) suspension 30 mL  30 mL Oral Daily PRN Laverle Hobby, PA-C      . multivitamin with minerals tablet 1 tablet  1 tablet Oral Daily Laverle Hobby, PA-C   1 tablet at 01/09/14 0857  . nicotine polacrilex (NICORETTE) gum 2 mg  2 mg Oral PRN Jenne Campus, MD      . ondansetron (ZOFRAN-ODT) disintegrating tablet 4 mg  4 mg Oral Q6H PRN Laverle Hobby, PA-C   4 mg at 01/09/14 1605  . sertraline (ZOLOFT) tablet 50 mg  50 mg Oral Daily Jenne Campus, MD   50 mg at 01/09/14 0857  . thiamine (B-1) injection 100 mg  100 mg Intramuscular Once Laverle Hobby, PA-C   100 mg at 01/06/14 2200  . thiamine (VITAMIN B-1) tablet 100 mg  100 mg Oral Daily Laverle Hobby, PA-C   100 mg at 01/09/14 0857  . traZODone (DESYREL) tablet 100 mg  100 mg Oral QHS PRN Jenne Campus, MD        Lab Results:  Results for orders placed or performed during the hospital encounter of 01/06/14 (from the past 48 hour(s))  Urinalysis, Routine w reflex microscopic     Status: Abnormal   Collection Time: 01/07/14  8:05 PM  Result Value Ref Range   Color, Urine YELLOW YELLOW   APPearance CLEAR CLEAR   Specific Gravity, Urine 1.009 1.005 - 1.030   pH 7.0 5.0 - 8.0   Glucose, UA NEGATIVE NEGATIVE mg/dL   Hgb urine dipstick LARGE (A) NEGATIVE   Bilirubin Urine NEGATIVE NEGATIVE   Ketones, ur NEGATIVE NEGATIVE mg/dL   Protein, ur NEGATIVE NEGATIVE mg/dL   Urobilinogen, UA 1.0 0.0 - 1.0 mg/dL   Nitrite NEGATIVE NEGATIVE   Leukocytes, UA NEGATIVE NEGATIVE    Comment: Performed at [facility]  Urine microscopic-add on     Status: None   Collection Time: 01/07/14  8:05 PM  Result Value Ref Range   Squamous Epithelial / LPF RARE RARE   WBC, UA 0-2 <3 WBC/hpf   RBC / HPF 11-20 <3 RBC/hpf   Bacteria, UA RARE RARE    Comment: Performed at [place] metabolic panel     Status: None   Collection Time:  01/08/14  6:21 AM  Result Value Ref Range   Sodium 139 137 - 147 mEq/L   Potassium 4.1 3.7 - 5.3 mEq/L   Chloride 104 96 - 112 mEq/L   CO2 23 19 - 32 mEq/L   Glucose, Bld 85 70 - 99 mg/dL   BUN 7 6 - 23 mg/dL   Creatinine, Ser 0.51 0.50 - 1.35 mg/dL   Calcium 9.3 8.4 - 10.5 mg/dL   GFR calc non Af Amer >90 >90 mL/min   GFR calc Af Amer >  90 >90 mL/min    Comment: (NOTE) The eGFR has been calculated using the CKD EPI equation. This calculation has not been validated in all clinical situations. eGFR's persistently <90 mL/min signify possible Chronic Kidney Disease.    Anion gap 12 5 - 15    Comment: Performed at [facility]  Urinalysis, Routine w reflex microscopic     Status: Abnormal   Collection Time: 01/08/14  2:52 PM  Result Value Ref Range   Color, Urine YELLOW YELLOW   APPearance CLOUDY (A) CLEAR   Specific Gravity, Urine 1.021 1.005 - 1.030   pH 7.0 5.0 - 8.0   Glucose, UA NEGATIVE NEGATIVE mg/dL   Hgb urine dipstick NEGATIVE NEGATIVE   Bilirubin Urine NEGATIVE NEGATIVE   Ketones, ur NEGATIVE NEGATIVE mg/dL   Protein, ur NEGATIVE NEGATIVE mg/dL   Urobilinogen, UA 2.0 (H) 0.0 - 1.0 mg/dL   Nitrite NEGATIVE NEGATIVE   Leukocytes, UA NEGATIVE NEGATIVE    Comment: MICROSCOPIC NOT DONE ON URINES WITH NEGATIVE PROTEIN, BLOOD, LEUKOCYTES, NITRITE, OR GLUCOSE <1000 mg/dL. Performed at [place] metabolic panel     Status: Abnormal   Collection Time: 01/09/14  6:25 AM  Result Value Ref Range   Sodium 134 (L) 137 - 147 mEq/L   Potassium 4.5 3.7 - 5.3 mEq/L   Chloride 99 96 - 112 mEq/L   CO2 21 19 - 32 mEq/L   Glucose, Bld 95 70 - 99 mg/dL   BUN 8 6 - 23 mg/dL   Creatinine, Ser 0.58 0.50 - 1.35 mg/dL   Calcium 9.6 8.4 - 10.5 mg/dL   GFR calc non Af Amer >90 >90 mL/min   GFR calc Af Amer >90 >90 mL/min    Comment: (NOTE) The eGFR has been calculated using the CKD EPI equation. This calculation has not been validated in all  clinical situations. eGFR's persistently <90 mL/min signify possible Chronic Kidney Disease.    Anion gap 14 5 - 15    Comment: Performed at [facility]    Physical Findings: AIMS: Facial and Oral Movements Muscles of Facial Expression: None, normal Lips and Perioral Area: None, normal Jaw: None, normal Tongue: None, normal,Extremity Movements Upper (arms, wrists, hands, fingers): Minimal Lower (legs, knees, ankles, toes): None, normal, Trunk Movements Neck, shoulders, hips: None, normal, Overall Severity Severity of abnormal movements (highest score from questions above): Minimal Incapacitation due to abnormal movements: Minimal Patient's awareness of abnormal movements (rate only patient's report): Aware, no distress, Dental Status Current problems with teeth and/or dentures?: Yes Does patient usually wear dentures?: Yes  CIWA:  CIWA-Ar Total: 4 COWS:     ASSESSMENT- Remains depressed and anxious, but slightly better than yesterday, and today a little more active in milieu. Repeat UA negative for Hgb and patient denies any hematuria. Hospitalist following for antihypertensive management. Tolerating Zoloft/Campral, Trazodone  And Ativan taper  well. Treatment Plan Summary: Daily contact with patient to assess and evaluate symptoms and progress in treatment Medication management See below  Plan: Continue inpatient treatment, milieu, support Continue Ativan ETOH detox protocol Continue Zoloft 50 mgrs QDAY  Increase Trazodone to 100 mgrs QHS to address ongoing insomnia Start Campral 666 mgrs TID for alcohol cravings    Medical Decision Making Problem Points:  Established problem, stable/improving (1), Review of last therapy session (1) and Review of psycho-social stressors (1) Data Points:  Review or order clinical lab tests (1) Review of medication regiment & side effects (2)  I certify that inpatient services furnished can reasonably be expected to  improve the patient's condition.   COBOS, FERNANDO 01/09/2014, 4:29 PM

## 2014-01-09 NOTE — Progress Notes (Signed)
  Progress note: PT clinically stable, VSS this AM.  Assessment and plan:  1. Accelerated hypertension - BP now improved on Norvasc 10 mg PO QD, Hydralazine 25 mg PO TID. BP 122/90. 2. Hematuria: one episode and as per the patient, repeat UA negative for blood, CT abd and pelvis pending  3. Alcohol abuse; currently in withdrawal . Further management as per psychiatry 4. Transaminitis.: possibly from alcohol abuse.  Awaiting CT abd and pelvis, if unremarkable, will sign off. Will provide further recommendations based on the Ct abd results. No need for urology consult at this time.  Debbora PrestoMAGICK-MYERS, ISKRA, MD  [facility] Pager [phone] Cell [phone]  If 7PM-7AM, please contact night-coverage [email or web address] Password [email or web address]

## 2014-01-09 NOTE — Progress Notes (Signed)
D: Patient is alert and oriented. Pt's mood and affect is depressed and flat. Pt complains of nausea, anxiety, and shakiness this morning. Pt remains in bed this am, not attending groups. Pt's BP is 159/95 and pulse 64 at 0700am. Pt denies SI/HI and AVH. Pt attending one afternoon group. A: Pt encouraged to get out of bed and attend groups today. PRN medication administered for nausea per providers orders (See MAR). Scheduled medications administered per providers orders (See MAR). 15 minute checks completed per protocol for pt safety.  R: Pt cooperative and receptive to nursing interventions.

## 2014-01-10 ENCOUNTER — Encounter (HOSPITAL_COMMUNITY): Payer: Self-pay

## 2014-01-10 ENCOUNTER — Ambulatory Visit (HOSPITAL_COMMUNITY)
Admission: AD | Admit: 2014-01-10 | Discharge: 2014-01-10 | Disposition: A | Discharge status: Home / Self Care | Payer: PRIVATE HEALTH INSURANCE | Source: Intra-hospital | Attending: Internal Medicine | Admitting: Internal Medicine

## 2014-01-10 DIAGNOSIS — N200 Calculus of kidney: Secondary | ICD-10-CM

## 2014-01-10 MED ORDER — LEVOFLOXACIN 500 MG PO TABS
500.0000 mg | ORAL_TABLET | Freq: Every day | ORAL | Status: DC
  Administered 2014-01-10 – 2014-01-15 (×6): 500 mg via ORAL
  Filled 2014-01-10 (×8): qty 1

## 2014-01-10 MED ORDER — HYDROXYZINE HCL 25 MG PO TABS
25.0000 mg | ORAL_TABLET | Freq: Four times a day (QID) | ORAL | Status: DC | PRN
  Administered 2014-01-11: 25 mg via ORAL
  Filled 2014-01-10: qty 1

## 2014-01-10 MED ORDER — SERTRALINE HCL 100 MG PO TABS
100.0000 mg | ORAL_TABLET | Freq: Every day | ORAL | Status: DC
  Administered 2014-01-11 – 2014-01-15 (×5): 100 mg via ORAL
  Filled 2014-01-10 (×8): qty 1

## 2014-01-10 MED ORDER — HYDROXYZINE HCL 50 MG PO TABS
50.0000 mg | ORAL_TABLET | Freq: Every evening | ORAL | Status: DC | PRN
  Administered 2014-01-10 – 2014-01-12 (×2): 50 mg via ORAL
  Filled 2014-01-10 (×4): qty 1
  Filled 2014-01-10: qty 3

## 2014-01-10 NOTE — Clinical Social Work Note (Signed)
CSW provided patient information about local shelters and [place] [facility]. Patient thanked CSW for assistance, has no further questions at this time.  Samuella BruinKristin Aleiya Rye, MSW, [employer] Clinical Social Worker [facility] [phone]

## 2014-01-10 NOTE — [facility] Group Notes (Signed)
Adult Psychoeducational Group Note  Date:  01/10/2014 Time:  10:13 PM  Group Topic/Focus:  AA Meeting  Participation Level:  Minimal  Participation Quality:  Attentive  Affect:  Flat  Cognitive:  Alert  Insight: Limited  Engagement in Group:  Limited  Modes of Intervention:  Discussion and Education  Additional Comments:  Fayrene FearingJames attended group.  Caroll RancherLindsay, Alette Kataoka A 01/10/2014, 10:13 PM

## 2014-01-10 NOTE — Plan of Care (Signed)
Problem: Ineffective individual coping Goal: STG-Increase in ability to manage activities of daily living Outcome: Progressing

## 2014-01-10 NOTE — [facility] Group Notes (Signed)
[facility] LCSW Group Therapy 01/10/2014 1:15 PM Type of Therapy: Group Therapy Participation Level: Active  Participation Quality: Attentive, Sharing and Supportive  Affect: Depressed and Flat  Cognitive: Alert and Oriented  Insight: Developing/Improving and Engaged  Engagement in Therapy: Developing/Improving and Engaged  Modes of Intervention: Clarification, Confrontation, Discussion, Education, Exploration, Limit-setting, Orientation, Problem-solving, Rapport Building, Dance movement psychotherapisteality Testing, Socialization and Support  Summary of Progress/Problems: The topic for today was feelings about relapse. Pt discussed what relapse prevention is to them and identified triggers that they are on the path to relapse. Pt processed their feeling towards relapse and was able to relate to peers. Pt discussed coping skills that can be used for relapse prevention. Patient reports that he wants to focus on his recovery but recently found out that he cannot return to his previous living arrangement. CSW's provided patient with emotional support and encouragement.   Samuella BruinKristin Oneda Duffett, MSW, [employer] Clinical Social Worker [facility] [phone]

## 2014-01-10 NOTE — Progress Notes (Addendum)
Patient ID: Colm Herbison, male   DOB: 09/10/1960, 53 y.o.   MRN: [id number]  [facility] PROGRESS NOTE  Hoover BrownsJames Pittinger QMV:[id number] DOB: 06/14/1960 DOA: 01/06/2014 PCP: No primary care provider on file.  Brief narrative: 53 year old gentleman with h/o hypertension, alcohol abuse, [facility]. His bp was found to be elevated and medical service was consulted. His HR appears to be within normal limits. He denies any chest pain, sob or pedal edema. He reports missing his medications all the time.   Assessment and Plan:    1. Accelerated hypertension - improved on Norvasc 10 mg QD and Hydralazine 25 mg TID, there is more room to titrate the dose of Hydralazine as indicated  2. Hematuria - on e episode, repeat UA with no evidence of hematuria 3. Bilateral non obstructive kidney stones - noted on CT abd and pelvis below, d/w urologist, no need for intervention at this time, conservative management with adequate oral intake  4. ? PNA - noted on CT abd/pelivs in the RML and RLL, will treat empirically with Levaquin for 5 days  5. Alcohol abuse - management as per psychiatry 6. Transaminitis - in the pattern of alcohol abuse, AST > ALT in 2:1 ratio  I will followup again tomorrow. Please contact me if I can be of assistance in the meanwhile. Thank you for this consultation.  Code Status: Full Family Communication: Pt at bedside  Procedures and diagnostic studies:   Ct Abdomen Pelvis Wo Contrast  01/10/2014   Bilateral nonobstructive kidney stones without evidence of hydronephrosis. There is a 4 mm right-sided stone and at least 3 1 mm-2 mm stones on the left.  Steatosis and borderline hepatomegaly.  Ill-defined ground-glass foci in the right middle lobe and right lower lobe, potentially representing inflammatory/infectious process. Recommend correlation with any respiratory symptoms.   Medical Consultants:   [facility] Anti-Infectives:   Levaquin 11/13 - for 5 days    Debbora PrestoMAGICK-Crysta Gulick, MD  [facility] Pager [phone]  If 7PM-7AM, please contact night-coverage [email or web address] Password [email or web address] 01/10/2014, 2:58 PM   LOS: 4 days   HPI/Subjective: No events overnight.   Objective: Filed Vitals:   01/10/14 0625 01/10/14 0626 01/10/14 1217 01/10/14 1447  BP: 143/90 130/88 145/90 132/92  Pulse: 72 75 66 70  Temp: 97.8 F (36.6 C)     TempSrc:      Resp: 16  18 20   SpO2:    99%   No intake or output data in the 24 hours ending 01/10/14 1458  Exam:   General:  Pt is alert, follows commands appropriately, not in acute distress  Cardiovascular: Regular rate and rhythm, S1/S2, no murmurs, no rubs, no gallops  Respiratory: Clear to auscultation bilaterally, no wheezing, no crackles, no rhonchi  Abdomen: Soft, non tender, non distended, bowel sounds present, no guarding   Data Reviewed: Basic Metabolic Panel:  Recent Labs Lab 01/05/14 0724 01/08/14 0621 01/09/14 0625  NA 148* 139 134*  K 3.4* 4.1 4.5  CL 105 104 99  CO2 28 23 21   GLUCOSE 86 85 95  BUN 4* 7 8  CREATININE 0.46* 0.51 0.58  CALCIUM 7.7* 9.3 9.6   Liver Function Tests:  Recent Labs Lab 01/05/14 0724  AST 166*  ALT 71*  ALKPHOS 224*  BILITOT 0.3  PROT 6.7  ALBUMIN 2.6*   CBC:  Recent Labs Lab 01/05/14 0724  WBC 4.4  HGB 13.8  HCT 40.5  MCV 101.3*  PLT 197    Scheduled  Meds: . acamprosate  666 mg Oral TID WC  . amLODipine  10 mg Oral Daily  . hydrALAZINE  25 mg Oral 3 times per day  . levofloxacin  500 mg Oral Daily  . [START ON 01/11/2014] LORazepam  1 mg Oral Daily  . multivitamin with minerals  1 tablet Oral Daily  . sertraline  50 mg Oral Daily  . thiamine  100 mg Intramuscular Once  . thiamine  100 mg Oral Daily   Continuous Infusions:

## 2014-01-10 NOTE — Plan of Care (Signed)
Problem: Diagnosis: Increased Risk For Suicide Attempt Goal: LTG-Patient Will Show Positive Response to Medication LTG (by discharge) : Patient will show positive response to medication and will participate in the development of the discharge plan.  Outcome: Progressing     

## 2014-01-10 NOTE — Progress Notes (Signed)
Patient ID: Richard Dunn, male   DOB: 08/27/1960, 53 y.o.   MRN: [id number]  D: Pt currently presents with a flat and depressed affect and depressed behavior. Per self inventory, pt rates depression at a 8, hopelessness 8 and anxiety 10. Pt's daily goal is to "try to maintain and keep my sanity" and they intend to do so by "try not to think about the negative." Pt reports fair sleep, poor concentration and a fair appetite. Pt reports that he has been having "strange dreams about his wallet and other things since being here." Pt also reports back, knee joint and neck at a 5 on a 0-10 scale. Pt states that he "was evicted, even though I had paid rent just because they are expanding." Pt states that this is what leaves him hopeless.    A: Pt provided with scheduled and PRN medications per providers orders. Pt's labs and vitals were monitored throughout the day. Pt supported emotionally and encouraged to express concerns and questions. Pt consulted with provider, nurse and social worker. Pt educated on medication administration and alcohol abuse. [facility] CT scan on pelvic area and abdomen. Pt encouraged to speak with social worker about housing post discharge.    R: Pt's safety ensured with 15 minute and environmental checks. Pt currently denies SI/HI and A/V hallucinations. Pt verbally agrees to seek staff if SI/HI or A/VH occurs and to consult with staff before acting on these thoughts. Results from CT scan are available (see results review). Pt is up and in the dayroom. Pt attended and participated in all groups except morning group today.    Aurora Maskwyman, Donnisha Besecker E, RN

## 2014-01-10 NOTE — [facility] Group Notes (Signed)
[facility] LCSW Group Therapy 01/10/2014  1:15 PM   Type of Therapy: Group Therapy  Participation Level: Did Not Attend- [facility].   Samuella BruinKristin Ambrie Carte, MSW, [employer] Clinical Social Worker [facility] [phone]

## 2014-01-10 NOTE — Tx Team (Signed)
Interdisciplinary Treatment Plan Update (Adult) Date: 01/10/2014   Time Reviewed: 9:30 AM  Progress in Treatment: Attending groups: Yes Participating in groups: Yes Taking medication as prescribed: Yes Tolerating medication: Yes Family/Significant other contact made: No, patient has declined for CSW to make collateral contact at this time Patient understands diagnosis: Yes Discussing patient identified problems/goals with staff: Yes Medical problems stabilized or resolved: Yes Denies suicidal/homicidal ideation: Yes Issues/concerns per patient self-inventory: Yes Other:  New problem(s) identified: N/A  Discharge Plan or Barriers: Patient plans to return to his friend's home at discharge and states that he will follow up with outpatient services on his own following discharge. Patient denies interest in residential treatment at this time. He is declining for CSW to make follow up appointments for him.  Reason for Continuation of Hospitalization:  Depression Anxiety Medication Stabilization   Comments: N/A  Estimated length of stay: 2-4 days  For review of initial/current patient goals, please see plan of care.  Patient is a 53 year old Caucasian Male with a diagnosis of Alcohol Dependence, Alcohol Withdrawal, Depression NOS, consider Alcohol Induced . [place] in [place], has been staying in a friend's garage since losing his housing in September. Patient cannot remember the events leading to current hospitalization. Admission notes indicate that patient was intoxicated at a laundromat. Patient has had multiple residential treatment stays before, the most recent being [facility] approximately 2 years ago. Patient is not interested in residential treatment at this time. Patient plans to return to his friend's garage and states that he will follow up with an outpatient provider on [address] but cannot recall the name of agency at this time. Patient identified his goals  as to "gey my life back on track." Patient reports increasing confusion, memory loss, and blacking out over the past year. Patient will benefit from crisis stabilization, medication evaluation, group therapy, and psycho education in addition to case management for discharge planning. Patient and CSW reviewed pt's identified goals and treatment plan. Pt verbalized understanding and agreed to treatment plan.  Attendees: Patient:    Family:    Physician: Dr. Jama Flavorsobos 01/10/2014 9:30 AM  Nursing: Earl ManySara Twyman, Quintella ReichertBeverly Knight, RN 01/10/2014 9:30 AM  Clinical Social Worker: Samuella BruinKristin Damary Doland, LCSWA 01/10/2014 9:30 AM  Other: Juline PatchQuylle Hodnett, LCSW 01/10/2014 9:30 AM  Other: Leisa LenzValerie Enoch, Vesta MixerMonarch Liaison 01/10/2014 9:30 AM  Other: Onnie BoerJennifer Clark, Case Manager 01/10/2014 9:30 AM  Other: Tomasita Morrowelora Sutton, P4CC 01/10/2014 9:30 AM  Other: Santa GeneraAnne Cunningham, LCSW 01/10/2014 9:30 AM  Other:    Other:    Other:    Other:        Scribe for Treatment Team:  Samuella BruinKristin Heath Tesler, MSW, [employer] [phone]

## 2014-01-10 NOTE — Progress Notes (Signed)
Patient ID: Richard Dunn, male   DOB: Aug 10, 1960, 53 y.o.   MRN: [id number] [facility] MD Progress Note  01/10/2014 3:05 PM [name]  MRN:  [id number] Subjective:  Patient reports ongoing vague symptoms of nausea and feeling fatigued , tired. Objective:Have discussed case with treatment team and have met with patient. Earlier today he had CT scan done, due to recent episode of hematuria. BP much improved following adjustment in antihypertensive .  Patient is slowly improving- and today he has been more visible in dayroom, participating in some milieu activities. Staff continue to note depression, anxiety. Patient has denied any  Active suicidal ideations and is able to contract for safety on the unit.  No medication side effects. Pelvic/Abdominal CT Result-  Bilateral nonobstructive kidney stones without evidence of hydronephrosis. There is a 4 mm right-sided stone and at least 3 1 mm-2 mm stones on the left. Steatosis and borderline hepatomegaly. Ill-defined ground-glass foci in the right middle lobe and right lower lobe, potentially representing inflammatory/infectious process. Recommend correlation with any respiratory symptoms.   Diagnosis:  Alcohol Dependence, Alcohol Withdrawal, Depression NOS, consider Alcohol Induced  Total Time spent with patient: 25 minutes     ADL's:  fair  Sleep: continuing to improve   Appetite: fair   Suicidal Ideation:  Denies any current SI Homicidal Ideation:  Denies  AEB (as evidenced by):  Psychiatric Specialty Exam: Physical Exam  Review of Systems  Constitutional: Positive for malaise/fatigue. Negative for fever and chills.  Respiratory: Positive for cough. Negative for shortness of breath.   Cardiovascular: Negative for chest pain.  Gastrointestinal: Positive for nausea. Negative for vomiting and blood in stool.  Skin: Negative for rash.  Neurological: Negative for seizures and headaches.  Psychiatric/Behavioral: Positive for  depression and substance abuse.    Blood pressure 132/92, pulse 70, temperature 97.8 F (36.6 C), temperature source Oral, resp. rate 20, SpO2 99 %.There is no weight on file to calculate BMI.  General Appearance: Fairly Groomed  Engineer, water::  Good  Speech:  Normal Rate  Volume:  improved speech/volume  Mood:  Remains depressed and constricted in affect   Affect:  Constricted, a little more reactive today  Thought Process:  Goal Directed and Linear  Orientation:  Other:  fully alert and attentive  Thought Content:  denies hallucinations,no delusions  Suicidal Thoughts:  No- at present denies any current SI and contracts for safety on the unit  Homicidal Thoughts:  No  Memory:  recent and remote grossly intact  Judgement:  Fair  Insight:  Fair  Psychomotor Activity:  Decreased, but slightly improved today  Concentration:  Fair  Recall:  Good  Fund of Knowledge:Good  Language: Good  Akathisia:  Negative  Handed:  Right  AIMS (if indicated):     Assets:  Desire for Improvement Resilience  Sleep:  Number of Hours: 6   Musculoskeletal: Strength & Muscle Tone: within normal limits Gait & Station: normal- walks slowly Patient leans: N/A  Current Medications: Current Facility-Administered Medications  Medication Dose Route Frequency Provider Last Rate Last Dose  . acamprosate (CAMPRAL) tablet 666 mg  666 mg Oral TID WC Jenne Campus, MD   666 mg at 01/10/14 1211  . acetaminophen (TYLENOL) tablet 650 mg  650 mg Oral Q6H PRN Laverle Hobby, PA-C   650 mg at 01/08/14 0844  . alum & mag hydroxide-simeth (MAALOX/MYLANTA) 200-200-20 MG/5ML suspension 30 mL  30 mL Oral Q4H PRN Laverle Hobby, PA-C      .  amLODipine (NORVASC) tablet 10 mg  10 mg Oral Daily Hosie Poisson, MD   10 mg at 01/10/14 8675  . hydrALAZINE (APRESOLINE) tablet 25 mg  25 mg Oral 3 times per day Hosie Poisson, MD   25 mg at 01/10/14 1451  . hydrOXYzine (ATARAX/VISTARIL) tablet 25 mg  25 mg Oral Q6H PRN Waylan Boga, NP      . levofloxacin (LEVAQUIN) tablet 500 mg  500 mg Oral Daily Theodis Blaze, MD   500 mg at 01/10/14 1451  . [START ON 01/11/2014] LORazepam (ATIVAN) tablet 1 mg  1 mg Oral Daily Spencer E Simon, PA-C      . magnesium hydroxide (MILK OF MAGNESIA) suspension 30 mL  30 mL Oral Daily PRN Laverle Hobby, PA-C      . multivitamin with minerals tablet 1 tablet  1 tablet Oral Daily Laverle Hobby, PA-C   1 tablet at 01/10/14 4492  . nicotine polacrilex (NICORETTE) gum 2 mg  2 mg Oral PRN Jenne Campus, MD      . sertraline (ZOLOFT) tablet 50 mg  50 mg Oral Daily Myer Peer Cobos, MD   50 mg at 01/10/14 0800  . thiamine (B-1) injection 100 mg  100 mg Intramuscular Once Laverle Hobby, PA-C   100 mg at 01/06/14 2200  . thiamine (VITAMIN B-1) tablet 100 mg  100 mg Oral Daily Laverle Hobby, PA-C   100 mg at 01/10/14 0100  . traZODone (DESYREL) tablet 100 mg  100 mg Oral QHS PRN Jenne Campus, MD        Lab Results:  Results for orders placed or performed during the hospital encounter of 01/06/14 (from the past 48 hour(s))  Basic metabolic panel     Status: Abnormal   Collection Time: 01/09/14  6:25 AM  Result Value Ref Range   Sodium 134 (L) 137 - 147 mEq/L   Potassium 4.5 3.7 - 5.3 mEq/L   Chloride 99 96 - 112 mEq/L   CO2 21 19 - 32 mEq/L   Glucose, Bld 95 70 - 99 mg/dL   BUN 8 6 - 23 mg/dL   Creatinine, Ser 0.58 0.50 - 1.35 mg/dL   Calcium 9.6 8.4 - 10.5 mg/dL   GFR calc non Af Amer >90 >90 mL/min   GFR calc Af Amer >90 >90 mL/min    Comment: (NOTE) The eGFR has been calculated using the CKD EPI equation. This calculation has not been validated in all clinical situations. eGFR's persistently <90 mL/min signify possible Chronic Kidney Disease.    Anion gap 14 5 - 15    Comment: Performed at [facility]    Physical Findings: AIMS: Facial and Oral Movements Muscles of Facial Expression: None, normal Lips and Perioral Area: None, normal Jaw: None,  normal Tongue: None, normal,Extremity Movements Upper (arms, wrists, hands, fingers): None, normal Lower (legs, knees, ankles, toes): None, normal, Trunk Movements Neck, shoulders, hips: None, normal, Overall Severity Severity of abnormal movements (highest score from questions above): None, normal Incapacitation due to abnormal movements: None, normal Patient's awareness of abnormal movements (rate only patient's report): No Awareness, Dental Status Current problems with teeth and/or dentures?: No Does patient usually wear dentures?: Yes  CIWA:  CIWA-Ar Total: 1 COWS:     ASSESSMENT- Ongoing depression, anxiety. Slowly becoming more active and visible on unit. He is tolerating medications well. Appreciate Hospitalist Follow up and input- BP improved . CT done earlier today does confirm nephrolithiasis.Patient denies any  further episodes of hematuria.  Treatment Plan Summary: Daily contact with patient to assess and evaluate symptoms and progress in treatment Medication management See below  Plan: Continue inpatient treatment, milieu, support Continue Ativan ETOH detox protocol Increase Zoloft  To 100 mgrs QDAY  Continue Trazodone to 100 mgrs QHS to address ongoing insomnia Continue Campral 666 mgrs TID    Medical Decision Making Problem Points:  Established problem, stable/improving (1), Review of last therapy session (1) and Review of psycho-social stressors (1) Data Points:  Review or order clinical lab tests (1) Review of medication regiment & side effects (2) Review of new medications or change in dosage (2)  I certify that inpatient services furnished can reasonably be expected to improve the patient's condition.   COBOS, [place] 01/10/2014, 3:05 PM

## 2014-01-10 NOTE — Plan of Care (Signed)
Problem: Ineffective individual coping Goal: STG: Patient will remain free from self harm Outcome: Progressing     

## 2014-01-10 NOTE — Progress Notes (Signed)
D: Pt was flat in affect and depressed in mood. Pt remained in the bed over the course of the shift. Pt presents with a CIWA of 4 that included tremors(2) and anxiety(2). Pt is currently denying any SI/HI/AVH. Pt denies any physical pain at this time.  A: Writer administered scheduled medications to pt, per MD orders. Continued support and availability as needed was extended to this pt. Staff continue to monitor pt with q3215min checks.  R: No adverse drug reactions noted. Pt receptive to treatment. Pt remains safe at this time.

## 2014-01-11 MED ORDER — ONDANSETRON HCL 4 MG PO TABS
4.0000 mg | ORAL_TABLET | Freq: Three times a day (TID) | ORAL | Status: DC | PRN
  Administered 2014-01-11: 4 mg via ORAL
  Filled 2014-01-11: qty 1

## 2014-01-11 NOTE — Progress Notes (Signed)
D: Pt remained in his room evening. Despite encouragement pt did not attend group. Pt reports to be improving in his mood. Pt reports difficulty sleeping on the previous nights. Pt informed writer that his use of Trazodone in the past led to a trip to the ED with the dx of priapism. This med has been discontinued and replaced with Vistaril for sleep. Pt is currently denying any SI/HI/AVH.  D: Pt is in affect and in mood. Pt rates her depression at a level out of ten and her anxiety as a . Pt attended group this evening. Pt observed interacting appropriately within the milieu.  A: Writer administered scheduled medications to pt. Continued support and availability as needed was extended to this pt. Staff continue to monitor pt with q5415min checks.  R: No adverse drug reactions noted. Pt receptive to treatment. Pt remains safe at this time.

## 2014-01-11 NOTE — Progress Notes (Signed)
Patient ID: Richard Dunn, male   DOB: 05/27/1960, 53 y.o.   MRN: [id number] D: Patient denies SI/HI and auditory and visual hallucinations. Patient has a depressed mood and affect. Has been sleeping as much as possible and set goal today to attend groups.Complaining some of pain related to inflammation of lung.  Has low energy.  A: Patient given emotional support from RN. Patient given medications per MD orders. Patient encouraged to attend groups and unit activities. Patient encouraged to come to staff with any questions or concerns.  R: Patient remains cooperative and appropriate. Will continue to monitor patient for safety.

## 2014-01-11 NOTE — [facility] Group Notes (Signed)
[facility] Group Notes:  Healthy coping skills  Date:  01/11/2014  Time:  2:12 PM  Type of Therapy:  Nurse Education  Participation Level:  Did Not Attend  Participation Quality:  Inattentive  Affect:  Flat  Cognitive:  Lacking  Insight:  None  Engagement in Group:  None  Modes of Intervention:  Discussion  Summary of Progress/Problems:Pt did not attend group  Rodman KeyWebb, Ziggy Reveles [facility] 01/11/2014, 2:12 PM

## 2014-01-11 NOTE — Plan of Care (Signed)
Problem: Alteration in mood & ability to function due to Goal: STG-Patient will report withdrawal symptoms Outcome: Progressing Pt. was able to verbalize his withdrawal symptoms (nausea & anxiety) when assessed this shift. Received PRN Zofran and Vistaril as ordered and reported relief of symptoms when reassessed.

## 2014-01-11 NOTE — Progress Notes (Signed)
  Seen and examined pt at bedside. He is clinically stable. BP better controlled.   1. Accelerated hypertension - improved on Norvasc 10 mg QD and Hydralazine 25 mg TID, there is more room to titrate the dose of Hydralazine if needed in future. Pt tolerating treatment well.  2. Hematuria - one episode, repeat UA with no evidence of hematuria. Pt denies additional episodes. 3. Bilateral non obstructive kidney stones - noted on CT abd and pelvis below, d/w urologist, no need for intervention at this time, conservative management with adequate oral intake. I discussed the findings with pt and he has verbalized understanding.  4. ? PNA - noted on CT abd/pelivs in the RML and RLL, will treat empirically with Levaquin for 5 days, today is day #2/5. Pt made aware.  5. Alcohol abuse - management as per psychiatry 6. Transaminitis - in the pattern of alcohol abuse, AST > ALT in 2:1 ratio  Please note will sign off for now but please call me with any questions. Thank you very much.  Debbora PrestoMAGICK-MYERS, ISKRA, MD  [facility] Cell [phone]  If 7PM-7AM, please contact night-coverage [email or web address] Password [email or web address]

## 2014-01-11 NOTE — [facility] Group Notes (Signed)
[facility] Group Notes:  Self inventory  Date:  01/11/2014  Time:  9:33 AM  Type of Therapy:  Nurse Education  Participation Level:  Active  Participation Quality:  Appropriate  Affect:  Appropriate  Cognitive:  Appropriate  Insight:  Appropriate  Engagement in Group:  Engaged  Modes of Intervention:  Education  Summary of Progress/Problems:  Richard Dunn, Richard Dunn 01/11/2014, 9:33 AM

## 2014-01-11 NOTE — Plan of Care (Signed)
Problem: Alteration in mood Goal: STG-Patient reports thoughts of self-harm to staff Outcome: Progressing Pt. denied thoughts of self harm when assessed this shift. He's been calm /cooperative with care but is withdrawned to his room with a flat affect.

## 2014-01-11 NOTE — Progress Notes (Signed)
Patient ID: Hoover BrownsJames Tortorella, male   DOB: 01/04/1961, 53 y.o.   [id number] [facility]  01/11/2014 11:37 AM Hoover BrownsJames Quinnell  MRN:  [id number] Subjective:  Patient states he is doing ok.  He is sitting in his room eating his lunch left overs. He states he slept fairly well, his appetite is not that good. He rates his depression as a 8/10, but denies SI/HI or AVH. He says his anxiety is a 10/10, and he reports continuing to be nauseated, blurred vision but denies vomiting, diarrhea or constipation. He reports he is taking his medication and is having no problems with that.  Objective:Patient is seen and the chart is reviewed. He is quiet and has poor eye contact. He is dressed, and appears shaky and frail. He is alert and oriented x 3.  Diagnosis:  Alcohol Dependence, Alcohol Withdrawal, Depression NOS, consider Alcohol Induced  Total Time spent with patient: 25 minutes     ADL's:  fair  Sleep: continuing to improve   Appetite: fair   Suicidal Ideation:  Denies any current SI Homicidal Ideation:  Denies  AEB (as evidenced by):  Psychiatric Specialty Exam: Physical Exam  Review of Systems  Constitutional: Positive for malaise/fatigue. Negative for fever and chills.  Respiratory: Positive for cough. Negative for shortness of breath.   Cardiovascular: Negative for chest pain.  Gastrointestinal: Positive for nausea. Negative for vomiting and blood in stool.  Skin: Negative for rash.  Neurological: Negative for seizures and headaches.  Psychiatric/Behavioral: Positive for depression and substance abuse.    Blood pressure 132/90, pulse 89, temperature 98.5 F (36.9 C), temperature source Oral, resp. rate 16, SpO2 99 %.There is no weight on file to calculate BMI.  General Appearance: Fairly Groomed  Patent attorneyye Contact::  Good  Speech:  Normal Rate  Volume:  improved speech/volume  Mood:  Remains depressed and constricted in affect   Affect:  Constricted, a little more reactive  today  Thought Process:  Goal Directed and Linear  Orientation:  Other:  fully alert and attentive  Thought Content:  denies hallucinations,no delusions  Suicidal Thoughts:  No- at present denies any current SI and contracts for safety on the unit  Homicidal Thoughts:  No  Memory:  recent and remote grossly intact  Judgement:  Fair  Insight:  Fair  Psychomotor Activity:  Decreased, but slightly improved today  Concentration:  Fair  Recall:  Good  Fund of Knowledge:Good  Language: Good  Akathisia:  Negative  Handed:  Right  AIMS (if indicated):     Assets:  Desire for Improvement Resilience  Sleep:  Number of Hours: 6.5   Musculoskeletal: Strength & Muscle Tone: within normal limits Gait & Station: normal- walks slowly Patient leans: N/A  Current Medications: Current Facility-Administered Medications  Medication Dose Route Frequency Provider Last Rate Last Dose  . acamprosate (CAMPRAL) tablet 666 mg  666 mg Oral TID WC Craige CottaFernando A Cobos, MD   666 mg at 01/11/14 0710  . acetaminophen (TYLENOL) tablet 650 mg  650 mg Oral Q6H PRN Kerry HoughSpencer E Simon, PA-C   650 mg at 01/08/14 0844  . alum & mag hydroxide-simeth (MAALOX/MYLANTA) 200-200-20 MG/5ML suspension 30 mL  30 mL Oral Q4H PRN Kerry HoughSpencer E Simon, PA-C      . amLODipine (NORVASC) tablet 10 mg  10 mg Oral Daily Kathlen ModyVijaya Akula, MD   10 mg at 01/11/14 0851  . hydrALAZINE (APRESOLINE) tablet 25 mg  25 mg Oral 3 times per day Kathlen ModyVijaya Akula, MD  25 mg at 01/11/14 0710  . hydrOXYzine (ATARAX/VISTARIL) tablet 25 mg  25 mg Oral Q6H PRN Nanine MeansJamison Lord, NP      . hydrOXYzine (ATARAX/VISTARIL) tablet 50 mg  50 mg Oral QHS PRN Nanine MeansJamison Lord, NP   50 mg at 01/10/14 2237  . levofloxacin (LEVAQUIN) tablet 500 mg  500 mg Oral Daily Dorothea OgleIskra M Myers, MD   500 mg at 01/11/14 [id number]  . magnesium hydroxide (MILK OF MAGNESIA) suspension 30 mL  30 mL Oral Daily PRN Kerry HoughSpencer E Simon, PA-C      . multivitamin with minerals tablet 1 tablet  1 tablet Oral Daily Kerry HoughSpencer E  Simon, PA-C   1 tablet at 01/11/14 0851  . nicotine polacrilex (NICORETTE) gum 2 mg  2 mg Oral PRN Craige CottaFernando A Cobos, MD      . sertraline (ZOLOFT) tablet 100 mg  100 mg Oral Daily Craige CottaFernando A Cobos, MD   100 mg at 01/11/14 0850  . thiamine (B-1) injection 100 mg  100 mg Intramuscular Once Kerry HoughSpencer E Simon, PA-C   100 mg at 01/06/14 2200  . thiamine (VITAMIN B-1) tablet 100 mg  100 mg Oral Daily Kerry HoughSpencer E Simon, PA-C   100 mg at 01/11/14 [id number]    Lab Results:  No results found for this or any previous visit (from the past 48 hour(s)).  Physical Findings: AIMS: Facial and Oral Movements Muscles of Facial Expression: None, normal Lips and Perioral Area: None, normal Jaw: None, normal Tongue: None, normal,Extremity Movements Upper (arms, wrists, hands, fingers): None, normal Lower (legs, knees, ankles, toes): None, normal, Trunk Movements Neck, shoulders, hips: None, normal, Overall Severity Severity of abnormal movements (highest score from questions above): None, normal Incapacitation due to abnormal movements: None, normal Patient's awareness of abnormal movements (rate only patient's report): No Awareness, Dental Status Current problems with teeth and/or dentures?: No Does patient usually wear dentures?: Yes  CIWA:  CIWA-Ar Total: 3 COWS:     ASSESSMENT- Ongoing depression, anxiety. Slowly becoming more active and visible on unit. He is tolerating medications well. Appreciate Hospitalist Follow up and input- BP improved . CT done earlier today does confirm nephrolithiasis.Patient denies any further episodes of hematuria.  Treatment Plan Summary: Daily contact with patient to assess and evaluate symptoms and progress in treatment Medication management See below  Plan: Continue inpatient treatment, milieu, support Continue Ativan ETOH detox protocol ContinueNeil T. Nicholad Kautzman RPAC 2:44 PM 01/11/2014  Zoloft  To 100 mgrs QDAY  Continue Trazodone to 100 mgrs QHS to address ongoing  insomnia Continue Campral 666 mgrs TID    Medical Decision Making Problem Points:  Established problem, stable/improving (1), Review of last therapy session (1) and Review of psycho-social stressors (1) Data Points:  Review or order clinical lab tests (1) Review of medication regiment & side effects (2) Review of new medications or change in dosage (2)  I certify that inpatient services furnished can reasonably be expected to improve the patient's condition.

## 2014-01-11 NOTE — [facility] Group Notes (Signed)
[facility] LCSW Group Therapy  01/11/2014 12:31 PM  Type of Therapy:  Group Therapy  Participation Level:  Did Not Attend  Participation Quality:  N/A  Affect:  N/A  Cognitive:  N/A  Insight:  N/A  Engagement in Therapy:  N/A  Modes of Intervention:  Discussion, Education, Exploration, Rapport Building and Support  Summary of Progress/Problems: Pt did not attend  [name], Richard Dunn 01/11/2014, 12:31 PM

## 2014-01-12 NOTE — Progress Notes (Signed)
Writer observed patient up in the dayroom watching tv. He reports his day as being okay. Patient  Reports that he has renal stones and was also told he has pneumonia. Patient voiced that he has mild soreness in his left chest wall and was offered tylenol for pain but he reports that it is tolerable and will wait until later. He voiced no other complaints and denies si/hi/a/v hallucinations. He voiced no withdrawal symptom. Support and encouragement given. Safety maintained on unit with 15 min checks.

## 2014-01-12 NOTE — Plan of Care (Signed)
Problem: Alteration in mood & ability to function due to Goal: STG-Patient will attend groups Outcome: Progressing Patient attended wrap up group this evening.

## 2014-01-12 NOTE — Progress Notes (Signed)
Patient ID: Richard Dunn, male   DOB: [birth year], 53 y.o.   MRN: [id number] [facility] MD Progress Note  01/12/2014 11:40 AM Richard Dunn  MRN:  [id number] Subjective:  Met with Richard Dunn today who says he is a bit better today and he has no new complaints. He says he knows he is better as he is not having as many tremors today as yesterday. He says he slept fairly well, his appetite is good, but he is still anxious over some business issues he must deal with.  Objective:  Patient is up and active in the unit milieu. Doesn't appear as frail today as yesterday. Appears to be responding to treatment, but continues to have mild visible tremors of upper extremities. Diagnosis:  Alcohol Dependence, Alcohol Withdrawal, Depression NOS, consider Alcohol Induced  Total Time spent with patient: 25 minutes     ADL's:  fair  Sleep: continuing to improve   Appetite: fair   Suicidal Ideation:  Denies any current SI Homicidal Ideation:  Denies  AEB (as evidenced by):  Psychiatric Specialty Exam: Physical Exam  Review of Systems  Constitutional: Positive for malaise/fatigue. Negative for fever and chills.  Respiratory: Positive for cough. Negative for shortness of breath.   Cardiovascular: Negative for chest pain.  Gastrointestinal: Positive for nausea. Negative for vomiting and blood in stool.  Skin: Negative for rash.  Neurological: Negative for seizures and headaches.  Psychiatric/Behavioral: Positive for depression and substance abuse.    Blood pressure 120/88, pulse 85, temperature 98.6 F (37 C), temperature source Oral, resp. rate 16, SpO2 99 %.There is no weight on file to calculate BMI.  General Appearance: Fairly Groomed  Engineer, water::  Good  Speech:  Normal Rate  Volume:  improved speech/volume  Mood:  Remains depressed and constricted in affect   Affect:  Constricted, a little more reactive today  Thought Process:  Goal Directed and Linear  Orientation:  Other:  fully alert and attentive   Thought Content:  denies hallucinations,no delusions  Suicidal Thoughts:  No- at present denies any current SI and contracts for safety on the unit  Homicidal Thoughts:  No  Memory:  recent and remote grossly intact  Judgement:  Fair  Insight:  Fair  Psychomotor Activity:  Decreased, but slightly improved today  Concentration:  Fair  Recall:  Good  Fund of Knowledge:Good  Language: Good  Akathisia:  Negative  Handed:  Right  AIMS (if indicated):     Assets:  Desire for Improvement Resilience  Sleep:  Number of Hours: 6.25   Musculoskeletal: Strength & Muscle Tone: within normal limits Gait & Station: normal- walks slowly Patient leans: N/A  Current Medications: Current Facility-Administered Medications  Medication Dose Route Frequency Provider Last Rate Last Dose  . acamprosate (CAMPRAL) tablet 666 mg  666 mg Oral TID WC Jenne Campus, MD   666 mg at 01/12/14 1136  . acetaminophen (TYLENOL) tablet 650 mg  650 mg Oral Q6H PRN Laverle Hobby, PA-C   650 mg at 01/08/14 0844  . alum & mag hydroxide-simeth (MAALOX/MYLANTA) 200-200-20 MG/5ML suspension 30 mL  30 mL Oral Q4H PRN Laverle Hobby, PA-C      . amLODipine (NORVASC) tablet 10 mg  10 mg Oral Daily Hosie Poisson, MD   10 mg at 01/12/14 0839  . hydrALAZINE (APRESOLINE) tablet 25 mg  25 mg Oral 3 times per day Hosie Poisson, MD   25 mg at 01/12/14 0634  . hydrOXYzine (ATARAX/VISTARIL) tablet 25 mg  25 mg Oral  Q6H PRN Waylan Boga, NP   25 mg at 01/11/14 1242  . hydrOXYzine (ATARAX/VISTARIL) tablet 50 mg  50 mg Oral QHS PRN Waylan Boga, NP   50 mg at 01/10/14 2237  . levofloxacin (LEVAQUIN) tablet 500 mg  500 mg Oral Daily Theodis Blaze, MD   500 mg at 01/12/14 0839  . magnesium hydroxide (MILK OF MAGNESIA) suspension 30 mL  30 mL Oral Daily PRN Laverle Hobby, PA-C      . multivitamin with minerals tablet 1 tablet  1 tablet Oral Daily Laverle Hobby, PA-C   1 tablet at 01/12/14 [phone]  . nicotine polacrilex (NICORETTE) gum 2  mg  2 mg Oral PRN Jenne Campus, MD      . ondansetron (ZOFRAN) tablet 4 mg  4 mg Oral Q8H PRN Charlcie Cradle, MD   4 mg at 01/11/14 1242  . sertraline (ZOLOFT) tablet 100 mg  100 mg Oral Daily Jenne Campus, MD   100 mg at 01/12/14 0839  . thiamine (B-1) injection 100 mg  100 mg Intramuscular Once Laverle Hobby, PA-C   100 mg at 01/06/14 2200  . thiamine (VITAMIN B-1) tablet 100 mg  100 mg Oral Daily Laverle Hobby, PA-C   100 mg at 01/12/14 2993    Lab Results:  No results found for this or any previous visit (from the past 48 hour(s)).  Physical Findings: AIMS: Facial and Oral Movements Muscles of Facial Expression: None, normal Lips and Perioral Area: None, normal Jaw: None, normal Tongue: None, normal,Extremity Movements Upper (arms, wrists, hands, fingers): None, normal Lower (legs, knees, ankles, toes): None, normal, Trunk Movements Neck, shoulders, hips: None, normal, Overall Severity Severity of abnormal movements (highest score from questions above): None, normal Incapacitation due to abnormal movements: None, normal Patient's awareness of abnormal movements (rate only patient's report): No Awareness, Dental Status Current problems with teeth and/or dentures?: No Does patient usually wear dentures?: Yes  CIWA:  CIWA-Ar Total: 2 COWS:     ASSESSMENT- Ongoing depression, anxiety. Slowly becoming more active and visible on unit. He is tolerating medications well. Appreciate Hospitalist Follow up and input- BP improved . CT done earlier today does confirm nephrolithiasis.Patient denies any further episodes of hematuria.  Treatment Plan Summary: Daily contact with patient to assess and evaluate symptoms and progress in treatment Medication management See below  Plan: Continue inpatient treatment, milieu, support Continue Ativan ETOH detox protocol ContinueNeil T. Eissa Buchberger RPAC 11:40 AM 01/12/2014  Zoloft  To 100 mgrs QDAY  Continue Trazodone to 100 mgrs QHS to  address ongoing insomnia Continue Campral 666 mgrs TID    Medical Decision Making Problem Points:  Established problem, stable/improving (1), Review of last therapy session (1) and Review of psycho-social stressors (1) Data Points:  Review or order clinical lab tests (1) Review of medication regiment & side effects (2) Review of new medications or change in dosage (2)  I certify that inpatient services furnished can reasonably be expected to improve the patient's condition.

## 2014-01-12 NOTE — [facility] Group Notes (Signed)
[facility] LCSW Group Therapy  01/12/2014   10:00 AM   Type of Therapy:  Group Therapy  Participation Level:  Active  Participation Quality:  Appropriate and Attentive  Affect:  Appropriate, Flat and Depressed  Cognitive:  Alert and Appropriate  Insight:  Developing/Improving and Engaged  Engagement in Therapy:  Developing/Improving and Engaged  Modes of Intervention:  Clarification, Confrontation, Discussion, Education, Exploration, Limit-setting, Orientation, Problem-solving, Rapport Building, Dance movement psychotherapisteality Testing, Socialization and Support  Summary of Progress/Problems: Today's group topic was avoiding self sabotage and enabling behaviors. Group members were asked to define self sabotage and enabling and provide examples. Group members were then asked to discuss unhealthy relationships and how to have positive healthy boundaries with those that enable. Group members were asked to process how communicating needs and establishing a plan to change the above identified behavior. Pt was quiet throughout group but appeared to actively listen.  Pt shared that he "almost drunk himself to death" and how detox is harder as he gets older.  Pt states that he is still dealing with withdrawal symptoms today.    Richard IvanChelsea Horton, LCSW 01/12/2014 10:56 AM

## 2014-01-12 NOTE — Progress Notes (Signed)
Patient ID: Richard Dunn, male   DOB: 05/27/1960, 53 y.o.   MRN: [id number] He has been up more today in dayroom with others has been talking more often. Continues to c/o withdrawal symptoms of tremors , nausea and chilling but has not requested any prn medications. Ambulation unsteady and slow. Self inventory: depressed and hope;ess at 5, anxiety 8, positive withdrawals, no c/o pain. Goal is to talk to MD about discharge.

## 2014-01-12 NOTE — Progress Notes (Signed)
Adult Psychoeducational Group Note  Date:  01/11/2014 Time:  11:49 PM  Group Topic/Focus:  Wrap-Up Group:   The focus of this group is to help patients review their daily goal of treatment and discuss progress on daily workbooks.  Participation Level:  Active  Participation Quality:  Appropriate, Attentive and Sharing  Affect:  Appropriate  Cognitive:  Appropriate  Insight: Appropriate  Engagement in Group:  Engaged  Modes of Intervention:  Support  Additional Comments:  Pt attended group and stated his goal for today was to get his blood pressure down, which it has. Pt stated his goal for tonight is to hopefully pass his kidney stone.  Caswell CorwinOwen, Cristel Rail C 01/11/2014, 11:49 PM

## 2014-01-12 NOTE — Progress Notes (Signed)
Pt attended the AA speaker meeting tonight. Pt was alert and engaged. Affect was appropriate. 

## 2014-01-13 NOTE — Progress Notes (Signed)
Writer has observed patient up in the dayroom watching tv and minimal interaction with peers. Patient attended AA group this evening. He c/o his vision being blurred and feels that it has gotten worst today. Writer encouraged patient to mention this to his doctor on tomorrow. He denies si/hi/a/v hallucinations. Support and encouragement given, safety maintained on unit with 15 min checks.

## 2014-01-13 NOTE — [facility] Group Notes (Signed)
[facility] LCSW Group Therapy 01/13/2014  1:15 pm  Type of Therapy: Group Therapy Participation Level: Active  Participation Quality: Attentive, Sharing and Supportive  Affect: Depressed and Flat  Cognitive: Alert and Oriented  Insight: Developing/Improving and Engaged  Engagement in Therapy: Developing/Improving and Engaged  Modes of Intervention: Clarification, Confrontation, Discussion, Education, Exploration,  Limit-setting, Orientation, Problem-solving, Rapport Building, Dance movement psychotherapisteality Testing, Socialization and Support  Summary of Progress/Problems: Pt identified obstacles faced currently and processed barriers involved in overcoming these obstacles. Pt identified steps necessary for overcoming these obstacles and explored motivation (internal and external) for facing these difficulties head on. Pt further identified one area of concern in their lives and chose a goal to focus on for today. Patient identified his primary obstacle as sobriety. Patient discussed his need to find a positive support system. CSW's and other group members provided emotional support and encouragement.  Samuella BruinKristin Daryle Boyington, MSW, [employer] Clinical Social Worker [facility] [phone]

## 2014-01-13 NOTE — [facility] Group Notes (Signed)
[facility]   01/13/2014 10:29 AM    Participation Quality:  Appropraite  Mood/Affect:  Appropriate  Depression Rating:  8  Anxiety Rating:  8  Thoughts of Suicide:  No  Will you contract for safety?   NA  Current AVH:  No  Plan for Discharge/Comments:  Patient attended discharge planning group and actively participated in group. Patient reports not doing well due to medical problems. Patient and his CSW are working on discharge plans.  Suicide prevention education reviewed and SPE document provided.   Transportation Means: Patient has transportation.   Supports:  Patient has a support system.   Nerissa Constantin, Joesph JulyQuylle Hairston

## 2014-01-13 NOTE — Progress Notes (Addendum)
Patient ID: Richard Dunn, male   DOB: 15-Sep-1960, 53 y.o.   MRN: [id number] [facility] MD Progress Note  01/13/2014 5:43 PM Richard Dunn  MRN:  [id number] Subjective: Patient is describing some improvement although he states he still feels " weak", and at times dizzy.  Objective:  I have discussed case with treatment team and have met with patient. He is improved compared to his initial presentation. He is not currently presenting with severe withdrawal and is not grossly tremulous or diaphoretic. His vitals are improved, and his BP is now within normal limits following adjustment of antihypertensive medications. He had an episode of hematuria- Abd/Pelvic CT shows kidney stones- at this time no gross hematuria or abdominal/pelvic pain reported or noted. Patient has become more visible on unit- less isolative. Although improved, he does continue to present with a depressed mood and a tendency towards  a constricted affect  No medication side effects- as noted, some dizziness ( no falls) , which may be related to antihypertensive medication adjustments. Diagnosis:  Alcohol Dependence, Alcohol Withdrawal, Depression NOS, consider Alcohol Induced  Total Time spent with patient: 25 minutes     ADL's:  Improved   Sleep: continuing to improve   Appetite:improved   Suicidal Ideation:  Denies any current SI Homicidal Ideation:  Denies  AEB (as evidenced by):  Psychiatric Specialty Exam: Physical Exam  Review of Systems  Constitutional: Positive for malaise/fatigue. Negative for fever and chills.  Respiratory: Positive for cough. Negative for shortness of breath.   Cardiovascular: Negative for chest pain.  Gastrointestinal: Positive for nausea. Negative for vomiting and blood in stool.  Skin: Negative for rash.  Neurological: Negative for seizures and headaches.  Psychiatric/Behavioral: Positive for depression and substance abuse.    Blood pressure 123/73, pulse 66, temperature 97.5 F (36.4  C), temperature source Oral, resp. rate 16, SpO2 99 %.There is no weight on file to calculate BMI.  General Appearance: improved grooming  Eye Contact::  Good  Speech:  Normal Rate  Volume:  improved speech/volume  Mood:  Less severely depressed, affect still constricted   Affect:  Constricted,   Thought Process:  Goal Directed and Linear  Orientation:  Other:  fully alert and attentive  Thought Content:  denies hallucinations,no delusions  Suicidal Thoughts:  No- at present denies any current SI and contracts for safety on the unit  Homicidal Thoughts:  No  Memory:  recent and remote grossly intact  Judgement:  Fair  Insight:  Fair  Psychomotor Activity:  Improving   Concentration:  Fair  Recall:  Good  Fund of Knowledge:Good  Language: Good  Akathisia:  Negative  Handed:  Right  AIMS (if indicated):     Assets:  Desire for Improvement Resilience  Sleep:  Number of Hours: 6.25   Musculoskeletal: Strength & Muscle Tone: within normal limits Gait & Station: normal- walks slowly Patient leans: N/A  Current Medications: Current Facility-Administered Medications  Medication Dose Route Frequency Provider Last Rate Last Dose  . acamprosate (CAMPRAL) tablet 666 mg  666 mg Oral TID WC Jenne Campus, MD   666 mg at 01/12/14 1136  . acetaminophen (TYLENOL) tablet 650 mg  650 mg Oral Q6H PRN Laverle Hobby, PA-C   650 mg at 01/08/14 0844  . alum & mag hydroxide-simeth (MAALOX/MYLANTA) 200-200-20 MG/5ML suspension 30 mL  30 mL Oral Q4H PRN Laverle Hobby, PA-C      . amLODipine (NORVASC) tablet 10 mg  10 mg Oral Daily Hosie Poisson, MD  10 mg at 01/12/14 0839  . hydrALAZINE (APRESOLINE) tablet 25 mg  25 mg Oral 3 times per day Hosie Poisson, MD   25 mg at 01/12/14 0634  . hydrOXYzine (ATARAX/VISTARIL) tablet 25 mg  25 mg Oral Q6H PRN Waylan Boga, NP   25 mg at 01/11/14 1242  . hydrOXYzine (ATARAX/VISTARIL) tablet 50 mg  50 mg Oral QHS PRN Waylan Boga, NP   50 mg at 01/10/14 2237   . levofloxacin (LEVAQUIN) tablet 500 mg  500 mg Oral Daily Theodis Blaze, MD   500 mg at 01/12/14 0839  . magnesium hydroxide (MILK OF MAGNESIA) suspension 30 mL  30 mL Oral Daily PRN Laverle Hobby, PA-C      . multivitamin with minerals tablet 1 tablet  1 tablet Oral Daily Laverle Hobby, PA-C   1 tablet at 01/12/14 [phone]  . nicotine polacrilex (NICORETTE) gum 2 mg  2 mg Oral PRN Jenne Campus, MD      . ondansetron (ZOFRAN) tablet 4 mg  4 mg Oral Q8H PRN Charlcie Cradle, MD   4 mg at 01/11/14 1242  . sertraline (ZOLOFT) tablet 100 mg  100 mg Oral Daily Jenne Campus, MD   100 mg at 01/12/14 0839  . thiamine (B-1) injection 100 mg  100 mg Intramuscular Once Laverle Hobby, PA-C   100 mg at 01/06/14 2200  . thiamine (VITAMIN B-1) tablet 100 mg  100 mg Oral Daily Laverle Hobby, PA-C   100 mg at 01/12/14 6767    Lab Results:  No results found for this or any previous visit (from the past 48 hour(s)).  Physical Findings: AIMS: Facial and Oral Movements Muscles of Facial Expression: None, normal Lips and Perioral Area: None, normal Jaw: None, normal Tongue: None, normal,Extremity Movements Upper (arms, wrists, hands, fingers): None, normal Lower (legs, knees, ankles, toes): None, normal, Trunk Movements Neck, shoulders, hips: None, normal, Overall Severity Severity of abnormal movements (highest score from questions above): None, normal Incapacitation due to abnormal movements: None, normal Patient's awareness of abnormal movements (rate only patient's report): No Awareness, Dental Status Current problems with teeth and/or dentures?: No Does patient usually wear dentures?: Yes  CIWA:  CIWA-Ar Total: 0 COWS:     ASSESSMENT- Gradual improvement compared to admission. He appears less frail, appetite has improved, and he is becoming more interactive in milieu. No severe withdrawal symptoms noted at this time. Tolerating medications well.  Treatment Plan Summary: Daily contact  with patient to assess and evaluate symptoms and progress in treatment Medication management See below  Plan: Continue inpatient treatment, milieu, support Continue Campral 666 mgrs TID  Zoloft   100 mgrs QDAY  Currently on Levaquin course for suspected pulmonary infection Treatment Team working on disposition planning-       Medical Decision Making Problem Points:  Established problem, stable/improving (1), Review of last therapy session (1) and Review of psycho-social stressors (1) Data Points:  Review of medication regiment & side effects (2)  I certify that inpatient services furnished can reasonably be expected to improve the patient's condition.

## 2014-01-13 NOTE — Plan of Care (Signed)
Problem: Alteration in mood & ability to function due to Goal: STG-Patient will attend groups Outcome: Progressing Patient attended group this evening     

## 2014-01-13 NOTE — Progress Notes (Signed)
D: Patient has sad, depressed affect and mood. He declined the self inventory sheet today. Patient rates depression/feelings of hopelessness "6" and anxiety "9". He's participating in groups and visible in the milieu. Patient is compliant with medication regimen.  A: Support and encouragement provided to patient. Scheduled medications administered per MD orders. Maintain Q15 minute checks for safety.  R: Patient receptive. Denies SI/HI/AVH. Patient remains safe on the hall.

## 2014-01-14 NOTE — Plan of Care (Signed)
Problem: Alteration in mood Goal: LTG-Patient reports reduction in suicidal thoughts (Patient reports reduction in suicidal thoughts and is able to verbalize a safety plan for whenever patient is feeling suicidal)  Outcome: Progressing Patient denies any suicidal thoughts today. Goal: STG-Pt Able to Identify Plan For Continuing Care at D/C Pt. Will be able to identify a plan for continuing care at discharge  Outcome: Progressing Patient is agreeable to returning to a shelter upon discharge.

## 2014-01-14 NOTE — Progress Notes (Signed)
Recreation Therapy Notes  Animal-Assisted Activity/Therapy (AAA/T) Program Checklist/Progress Notes Patient Eligibility Criteria Checklist & Daily Group note for Rec Tx Intervention  Date: 11.17.2015 Time: 2:45pm Location: [address] Dayroom    AAA/T Program Assumption of Risk Form signed by Patient/ or Parent Legal Guardian yes  Patient is free of allergies or sever asthma yes  Patient reports no fear of animals yes  Patient reports no history of cruelty to animals yes   Patient understands his/her participation is voluntary yes  Patient washes hands before animal contact yes  Patient washes hands after animal contact yes  Behavioral Response: Appropriate    Education: Hand Washing, Appropriate Animal Interaction   Education Outcome: Acknowledges education.   Clinical Observations/Feedback: Patient actively engaged in group session, petting therapy dog appropriately and interacting with peers appropriately. Patient exited session at approximately at approximately 3:00pm, patient did not return to session.   Richard Dunn, Richard Dunn  Richard Dunn 01/14/2014 4:14 PM

## 2014-01-14 NOTE — [facility] Group Notes (Signed)
[facility] Group Notes: stress  Date:  01/14/2014  Time:  9:33 AM  Type of Therapy:  Nurse Education  Participation Level:  Active  Participation Quality:  Appropriate  Affect:  Appropriate  Cognitive:  Alert  Insight:  Appropriate  Engagement in Group:  Engaged  Modes of Intervention:  Discussion  Summary of Progress/Problems:  Nicole CellaWebb, Nayelis Bonito Guyes 01/14/2014, 9:33 AM

## 2014-01-14 NOTE — Clinical Social Work Note (Signed)
[facility]. RN recommended ACTT team through [facility] as patient needs a higher level of care.  Samuella BruinKristin Terrian Ridlon, MSW, [employer]CS[employer] Clinical Social Worker [facility] [phone]

## 2014-01-14 NOTE — Progress Notes (Signed)
Patient ID: Richard Dunn, male   DOB: 05/01/1960, 53 y.o.   MRN: [id number]   D: Pt was pleasant and cooperative. Informed the writer that he's feeling better each day. Stated, "I'm getting older and it's a little harder to bounce back".  Writer confirmed that age makes a difference in how the body tolerates different substances.   A:  Support and encouragement was offered. 15 min checks continued for safety.  R: Pt remains safe.

## 2014-01-14 NOTE — [facility] Group Notes (Addendum)
[facility] LCSW Group Therapy  01/14/2014   1:15 PM   Type of Therapy:  Group Therapy  Participation Level:  Active  Participation Quality:  Attentive, Sharing and Supportive  Affect:  Depressed and Flat  Cognitive:  Alert and Oriented  Insight:  Developing/Improving and Engaged  Engagement in Therapy:  Developing/Improving and Engaged  Modes of Intervention:  Clarification, Confrontation, Discussion, Education, Exploration, Limit-setting, Orientation, Problem-solving, Rapport Building, Dance movement psychotherapisteality Testing, Socialization and Support  Summary of Progress/Problems: The topic for group therapy was feelings about diagnosis.  Pt actively participated in group discussion on their past and current diagnosis and how they feel towards this.  Pt also identified how society and family members judge them, based on their diagnosis as well as stereotypes and stigmas.  Patient discussed his desire to focus on his recovery and continue taking his medications as prescribed. CSW's and other group members provided emotional support and encouragement.  Samuella BruinKristin Alfonso Shackett, MSW, [employer] Clinical Social Worker [facility] [phone]

## 2014-01-14 NOTE — Progress Notes (Signed)
Pt attended spiritual care group on grief and loss facilitated by chaplain Burnis [facility] and counseling intern [place]. Group opened with brief discussion and psycho-social ed around grief and loss in relationships and in relation to self - identifying life patterns, circumstances, changes that cause losses. Established group norm of speaking from own life experience. Group goal of establishing open and affirming space for members to share loss and experience with grief, normalize grief experience and provide psycho social education and grief support.   Richard Dunn was present throughout group.  He was attentive to group members as they shared.  He did not engage group members or facilitators.   Belva CromeStalnaker, Delphine Sizemore Wayne MDiv 12:21 PM  01/14/2014

## 2014-01-14 NOTE — Tx Team (Addendum)
Interdisciplinary Treatment Plan Update (Adult) Date: 01/14/2014   Time Reviewed: 9:30 AM  Progress in Treatment: Attending groups: Yes Participating in groups: Yes Taking medication as prescribed: Yes Tolerating medication: Yes Family/Significant other contact made: No, patient has declined for CSW to make collateral contact at this time Patient understands diagnosis: Yes Discussing patient identified problems/goals with staff: Yes Medical problems stabilized or resolved: Yes Denies suicidal/homicidal ideation: Yes, denies Issues/concerns per patient self-inventory: Yes Other:  New problem(s) identified: N/A  Discharge Plan or Barriers: Patient plans to discharge to local shelter and states that he will follow up with outpatient services on his own following discharge. Patient denies interest in residential treatment at this time. He is declining for CSW to make follow up appointments for him.  Reason for Continuation of Hospitalization:  Depression Anxiety Medication Stabilization   Comments: N/A  Estimated length of stay: Discharge anticipated for tomorrow 01/15/14.  For review of initial/current patient goals, please see plan of care.  Patient is a 53 year old Caucasian Male with a diagnosis of Alcohol Dependence, Alcohol Withdrawal, Depression NOS, consider Alcohol Induced . [place], has been staying in a friend's garage since losing his housing in September. Patient cannot remember the events leading to current hospitalization. Admission notes indicate that patient was intoxicated at a laundromat. Patient has had multiple residential treatment stays before, the most recent being [facility] approximately 2 years ago. Patient is not interested in residential treatment at this time. Patient plans to return to his friend's garage and states that he will follow up with an outpatient provider [address] but cannot recall the name of agency at this  time. Patient identified his goals as to "gey my life back on track." Patient reports increasing confusion, memory loss, and blacking out over the past year. Patient will benefit from crisis stabilization, medication evaluation, group therapy, and psycho education in addition to case management for discharge planning. Patient and CSW reviewed pt's identified goals and treatment plan. Pt verbalized understanding and agreed to treatment plan.  Attendees: Patient:    Family:    Physician: Dr. Jama Flavorsobos 01/14/2014 9:30 AM  Nursing: Burnetta SabinBrittany Tyson, Norville Haggardaroline B., Janet Marshall, RN 01/14/2014 9:30 AM  Clinical Social Worker: Belenda CruiseKristin Maven Varelas,  LCSWA 01/14/2014 9:30 AM  Other: Juline PatchQuylle Hodnett, LCSW 01/14/2014 9:30 AM  Other: Leisa LenzValerie Enoch, [facility] Liaison 01/14/2014 9:30 AM  Other: Sydell AxonElaina, Pharmacist  01/14/2014 9:30 AM  Other:  01/14/2014 9:30 AM  Other:  01/14/2014 9:30 AM  Other:    Other:    Other:    Other:        Scribe for Treatment Team:  Samuella BruinKristin Jerrold Haskell, MSW, [employer]CS[employer] [phone]

## 2014-01-14 NOTE — Progress Notes (Signed)
Patient ID: Richard Dunn, male   DOB: 03/05/1960, 53 y.o.   MRN: [id number] Patient's 1400 scheduled apresolene held due low BP of 114/76 sitting and 111/70 standing.  Held [facility].

## 2014-01-14 NOTE — Progress Notes (Signed)
Patient ID: Richard Dunn, male   DOB: [birth year], 53 y.o.   MRN: [id number] [facility] MD Progress Note  01/14/2014 4:07 PM Nathanael Krist  MRN:  [id number] Subjective:  Patient states he still feels somewhat depressed and still does not feel " back to normal" but does acknowledge significant improvement compared to admission. Objective:  I have discussed case with treatment team and have met with patient.  Patient continues to improve gradually compared to admission status. He is visible on the unit now, and has been participating in milieu regularly lately. He is not currently endorsing medication side effects. Patient is becoming more future oriented and today  Focused more on discharge planning. Stated he was interested in following up at [facility] [place]. As per SW note, he may also benefit from referral to an ACT team. His vital signs are much improved compared to his initial presentation when he had significantly elevated BP. His  BP today is 111/70 No current significant symptoms of alcohol WDL. Today not complaining of dizziness and gait seems steady.  Diagnosis:  Alcohol Dependence, Alcohol Withdrawal, Depression NOS, consider Alcohol Induced  Total Time spent with patient: 25 minutes     ADL's:  Improved   Sleep: continuing to improve   Appetite:improved   Suicidal Ideation:  Denies any current SI Homicidal Ideation:  Denies  AEB (as evidenced by):  Psychiatric Specialty Exam: Physical Exam  Review of Systems  Constitutional: Positive for malaise/fatigue. Negative for fever and chills.  Respiratory: Positive for cough. Negative for shortness of breath.   Cardiovascular: Negative for chest pain.  Gastrointestinal: Positive for nausea. Negative for vomiting and blood in stool.  Skin: Negative for rash.  Neurological: Negative for seizures and headaches.  Psychiatric/Behavioral: Positive for depression and substance abuse.    Blood pressure 111/70, pulse 63,  temperature 97.6 F (36.4 C), temperature source Oral, resp. rate 18, SpO2 99 %.There is no weight on file to calculate BMI.  General Appearance: improved grooming  Eye Contact::  Good  Speech:  Normal Rate  Volume:  improved speech/volume  Mood:  Although still depressed, he is better, and is presenting with a somewhat improved range of affect  Affect:  Constricted,   Thought Process:  Goal Directed and Linear  Orientation:  Other:  fully alert and attentive  Thought Content:  denies hallucinations,no delusions  Suicidal Thoughts:  No- at present denies any current SI and contracts for safety on the unit  Homicidal Thoughts:  No  Memory:  recent and remote grossly intact  Judgement:  Fair  Insight:  Fair  Psychomotor Activity:  Improving   Concentration:  Fair  Recall:  Good  Fund of Knowledge:Good  Language: Good  Akathisia:  Negative  Handed:  Right  AIMS (if indicated):     Assets:  Desire for Improvement Resilience  Sleep:  Number of Hours: 6.25   Musculoskeletal: Strength & Muscle Tone: within normal limits Gait & Station: normal- walks slowly Patient leans: N/A  Current Medications: Current Facility-Administered Medications  Medication Dose Route Frequency Provider Last Rate Last Dose  . acamprosate (CAMPRAL) tablet 666 mg  666 mg Oral TID WC Jenne Campus, MD   666 mg at 01/12/14 1136  . acetaminophen (TYLENOL) tablet 650 mg  650 mg Oral Q6H PRN Laverle Hobby, PA-C   650 mg at 01/08/14 0844  . alum & mag hydroxide-simeth (MAALOX/MYLANTA) 200-200-20 MG/5ML suspension 30 mL  30 mL Oral Q4H PRN Laverle Hobby, PA-C      .  amLODipine (NORVASC) tablet 10 mg  10 mg Oral Daily Hosie Poisson, MD   10 mg at 01/12/14 5597  . hydrALAZINE (APRESOLINE) tablet 25 mg  25 mg Oral 3 times per day Hosie Poisson, MD   25 mg at 01/12/14 0634  . hydrOXYzine (ATARAX/VISTARIL) tablet 25 mg  25 mg Oral Q6H PRN Waylan Boga, NP   25 mg at 01/11/14 1242  . hydrOXYzine (ATARAX/VISTARIL)  tablet 50 mg  50 mg Oral QHS PRN Waylan Boga, NP   50 mg at 01/10/14 2237  . levofloxacin (LEVAQUIN) tablet 500 mg  500 mg Oral Daily Theodis Blaze, MD   500 mg at 01/12/14 0839  . magnesium hydroxide (MILK OF MAGNESIA) suspension 30 mL  30 mL Oral Daily PRN Laverle Hobby, PA-C      . multivitamin with minerals tablet 1 tablet  1 tablet Oral Daily Laverle Hobby, PA-C   1 tablet at 01/12/14 [phone]  . nicotine polacrilex (NICORETTE) gum 2 mg  2 mg Oral PRN Jenne Campus, MD      . ondansetron (ZOFRAN) tablet 4 mg  4 mg Oral Q8H PRN Charlcie Cradle, MD   4 mg at 01/11/14 1242  . sertraline (ZOLOFT) tablet 100 mg  100 mg Oral Daily Jenne Campus, MD   100 mg at 01/12/14 0839  . thiamine (B-1) injection 100 mg  100 mg Intramuscular Once Laverle Hobby, PA-C   100 mg at 01/06/14 2200  . thiamine (VITAMIN B-1) tablet 100 mg  100 mg Oral Daily Laverle Hobby, PA-C   100 mg at 01/12/14 8453    Lab Results:  No results found for this or any previous visit (from the past 48 hour(s)).  Physical Findings: AIMS: Facial and Oral Movements Muscles of Facial Expression: None, normal Lips and Perioral Area: None, normal Jaw: None, normal Tongue: None, normal,Extremity Movements Upper (arms, wrists, hands, fingers): None, normal Lower (legs, knees, ankles, toes): None, normal, Trunk Movements Neck, shoulders, hips: None, normal, Overall Severity Severity of abnormal movements (highest score from questions above): None, normal Incapacitation due to abnormal movements: None, normal Patient's awareness of abnormal movements (rate only patient's report): No Awareness, Dental Status Current problems with teeth and/or dentures?: No Does patient usually wear dentures?: Yes  CIWA:  CIWA-Ar Total: 0 COWS:     ASSESSMENT- Slow/gradual but at this time noticeable improvement compared to admission status. Mood is less depressed, he is more interactive, and medically he has improved BP and is eating better,  regaining some of the weight he had lost.  Treatment Plan Summary: Daily contact with patient to assess and evaluate symptoms and progress in treatment Medication management See below  Plan: Continue inpatient treatment, milieu, support Continue Campral 666 mgrs TID  Zoloft   100 mgrs QDAY  Continue  Levaquin  Treatment Team working on disposition planning- consider discharge tomorrow as he continues to stabilize.      Medical Decision Making Problem Points:  Established problem, stable/improving (1), Review of last therapy session (1) and Review of psycho-social stressors (1) Data Points:  Review of medication regiment & side effects (2)  I certify that inpatient services furnished can reasonably be expected to improve the patient's condition.

## 2014-01-14 NOTE — Progress Notes (Signed)
Patient ID: Richard Dunn, male   DOB: 11/08/1960, 53 y.o.   MRN: [id number] D: Patient states his sleep is fair without medication.  His appetite is good and his energy level is low.  Patient vital signs have stabilized and are WNL today.  He is less tremulous compared with earlier reports and no notable withdrawals symptoms has been observed.  His presents with sad, depressed mood and flat affect.  His interaction with staff and his peers are minimal.  From earlier reports, patient is less isolative and is more visible on the unit.  He is attending groups today.  He rates his depression and hopelessness as a 7; anxiety as a 9.  Patient denies any SI/HI/AVH.  His plan for discharge is to go to a shelter.  Patient was drinking wine/liquor and listerine when he couldn't afford the former.  He is pleasant and cooperative. A: Continue to monitor medication management and MD orders.  Safety checks completed every 15 minutes per protocol. R: Patient's behavior is appropriate to situation.

## 2014-01-15 DIAGNOSIS — F1024 Alcohol dependence with alcohol-induced mood disorder: Secondary | ICD-10-CM | POA: Insufficient documentation

## 2014-01-15 MED ORDER — HYDRALAZINE HCL 25 MG PO TABS: 25.0000 mg | ORAL_TABLET | Freq: Three times a day (TID) | ORAL | Status: AC

## 2014-01-15 MED ORDER — SERTRALINE HCL 100 MG PO TABS: 100.0000 mg | ORAL_TABLET | Freq: Every day | ORAL | Status: AC

## 2014-01-15 MED ORDER — ACAMPROSATE CALCIUM 333 MG PO TBEC: 666.0000 mg | DELAYED_RELEASE_TABLET | Freq: Three times a day (TID) | ORAL | Status: AC

## 2014-01-15 MED ORDER — AMLODIPINE BESYLATE 10 MG PO TABS: 10.0000 mg | ORAL_TABLET | Freq: Every day | ORAL | Status: AC

## 2014-01-15 MED ORDER — ADULT MULTIVITAMIN W/MINERALS CH: 1.0000 | ORAL_TABLET | Freq: Every day | ORAL | Status: AC

## 2014-01-15 MED ORDER — HYDROXYZINE HCL 50 MG PO TABS: 50.0000 mg | ORAL_TABLET | Freq: Every evening | ORAL | Status: AC | PRN

## 2014-01-15 NOTE — Clinical Social Work Note (Signed)
CSW provided patient with listing of local shelters to contact this week regarding bed availability.   Patient confirmed that he can stay with a friend in [place] named Jorja Loaim for a few days but does not have friend's number to call him. [facility] to provide transportation as patient reports that he is certain that he can stay at friend's house.  Samuella BruinKristin Heran Campau, LCSWA Clinical Social Worker [facility] [phone]

## 2014-01-15 NOTE — Discharge Summary (Signed)
Physician Discharge Summary Note  Patient:  Richard BrownsJames Shepardson is an 53 y.o., male MRN:  [id number] DOB:  11/20/1960 Patient phone:  [phone] (home)  Patient address:   [address] [place] [address],  Total Time spent with patient: 30 minutes  Date of Admission:  01/06/2014 Date of Discharge: 01/15/14  Reason for Admission:  Alcohol abuse, Depression  Discharge Diagnoses: Active Problems:   Alcohol abuse with alcohol-induced disorder   Renal stones   Alcohol dependence with alcohol-induced mood disorder  Psychiatric Specialty Exam: Physical Exam  Psychiatric: He has a normal mood and affect. His speech is normal and behavior is normal. Judgment and thought content normal. Cognition and memory are normal.    Review of Systems  Constitutional: Negative.   HENT: Negative.   Eyes: Negative.   Respiratory: Negative.   Cardiovascular: Negative.   Gastrointestinal: Negative.   Genitourinary: Negative.   Musculoskeletal: Negative.   Skin: Negative.   Neurological: Negative.   Endo/Heme/Allergies: Negative.   Psychiatric/Behavioral: Positive for depression (Stabilized ) and substance abuse (Stabilized ).    Blood pressure 130/82, pulse 63, temperature 97.7 F (36.5 C), temperature source Oral, resp. rate 19, SpO2 99 %.There is no weight on file to calculate BMI.  See Physician SRA      Past Psychiatric History: See H&P Diagnosis:  Hospitalizations:  Outpatient Care:  Substance Abuse Care:  Self-Mutilation:  Suicidal Attempts:  Violent Behaviors:   Musculoskeletal: Strength & Muscle Tone: within normal limits Gait & Station: normal Patient leans: N/A  DSM5:  AXIS l:   Alcohol Dependence, Alcohol Withdrawal, Depression NOS AXIS II: Deferred AXIS III:  Past Medical History  Diagnosis Date  . Hypertension   . Degenerative disc disease   . Depression    AXIS IV: economic problems, occupational problems and problems related to social  environment AXIS V: 60 upon discharge   Level of Care:  OP  Hospital Course:  Richard BrownsJames Mcilvain is a 53 year old man, who has a history of alcohol dependence. As per chart notes, he was found In a laundromat intoxicated , drinking listerine, and unable to walk and brought to hospital . Patient has no memory of this and his last memory before hospital is " going in to the [place] because it was chilly outside".Patient states he has been depressed recently, and also endorses history of alcohol dependence . He states he drinks daily, amounts vary depending on availability but often more than a bottle of liquor or wine. Drinks listerine when cannot afford other ETOH.          Richard BrownsJames Vanderstelt was admitted to the adult unit. He was evaluated and his symptoms were identified. Medication management was discussed and initiated. The patient was not taking any medications prior to admission. Patient was started on Campral 666 mg TID for treatment of alcohol dependence and Zoloft 100 mg daily for depressive symptoms. The patient was placed on the Ativan taper due to elevated liver enzymes, which were noted to be in the pattern of alcohol abuse. This was indicated by the AST being higher than the ALT. He was oriented to the unit and encouraged to participate in unit programming. Medical problems were identified and treated appropriately. The patient was seen by Internal Medicine for evaluation of Hypertension. This consult resulted in the patient being started on Norvasc and Apresoline for treatment of Hypertension with positive results. He also was ordered a five day course of Levaquin 500 mg daily for possible pneumonia.  The patient was evaluated each day by a clinical provider to ascertain the patient's response to treatment.  Improvement was noted by the patient's report of decreasing symptoms, improved sleep and appetite, affect, medication tolerance, behavior, and participation in unit programming.  He was  asked each day to complete a self inventory noting mood, mental status, pain, new symptoms, anxiety and concerns.         He responded well to medication and being in a therapeutic and supportive environment. He reported significantly decreased levels of depression after treatment. Positive and appropriate behavior was noted and the patient was motivated for recovery.  The patient worked closely with the treatment team and case manager to develop a discharge plan with appropriate goals. Coping skills, problem solving as well as relaxation therapies were also part of the unit programming.         By the day of discharge he was in much improved condition than upon admission.  Symptoms were reported as significantly decreased or resolved completely. The patient denied SI/HI and voiced no AVH. He was motivated to continue taking medication with a goal of continued improvement in mental health.  Richard BrownsJames Weinhold was discharged home with a plan to follow up as noted below. Patient was provided with prescriptions and sample medications. He left [facility] in stable condition with all belongings returned to him.   Consults:  Internal Medicine for elevated blood pressure  Significant Diagnostic Studies:  Chemistry panel, UA, CBC  Discharge Vitals:   Blood pressure 130/82, pulse 63, temperature 97.7 F (36.5 C), temperature source Oral, resp. rate 19, SpO2 99 %. There is no weight on file to calculate BMI. Lab Results:   No results found for this or any previous visit (from the past 72 hour(s)).  Physical Findings: AIMS: Facial and Oral Movements Muscles of Facial Expression: None, normal Lips and Perioral Area: None, normal Jaw: None, normal Tongue: None, normal,Extremity Movements Upper (arms, wrists, hands, fingers): None, normal Lower (legs, knees, ankles, toes): None, normal, Trunk Movements Neck, shoulders, hips: None, normal, Overall Severity Severity of abnormal movements (highest score from questions  above): None, normal Incapacitation due to abnormal movements: None, normal Patient's awareness of abnormal movements (rate only patient's report): No Awareness, Dental Status Current problems with teeth and/or dentures?: No Does patient usually wear dentures?: Yes  CIWA:  CIWA-Ar Total: 0 COWS:     Psychiatric Specialty Exam: See Psychiatric Specialty Exam and Suicide Risk Assessment completed by Attending Physician prior to discharge.  Discharge destination:  Home  Is patient on multiple antipsychotic therapies at discharge:  No   Has Patient had three or more failed trials of antipsychotic monotherapy by history:  No  Recommended Plan for Multiple Antipsychotic Therapies: NA  Discharge Instructions    Discharge instructions    Complete by:  As directed   Please scheduled follow up with your Primary Care Provider for further management of medical problems such as Hypertension. During your hospital stay you were started on Norvasc 10 mg daily and Apresoline 25 mg three times daily for blood pressure control. You were also treated with a five day course of Levaquin 500 mg for possible pneumonia. Prescriptions for the blood pressure medications were provided for thirty days.            Medication List    TAKE these medications      Indication   acamprosate 333 MG tablet  Commonly known as:  CAMPRAL  Take 2 tablets (666 mg total) by mouth  3 (three) times daily with meals.   Indication:  Excessive Use of Alcohol     amLODipine 10 MG tablet  Commonly known as:  NORVASC  Take 1 tablet (10 mg total) by mouth daily.   Indication:  High Blood Pressure     hydrALAZINE 25 MG tablet  Commonly known as:  APRESOLINE  Take 1 tablet (25 mg total) by mouth every 8 (eight) hours.   Indication:  High Blood Pressure     hydrOXYzine 50 MG tablet  Commonly known as:  ATARAX/VISTARIL  Take 1 tablet (50 mg total) by mouth at bedtime as needed (insomnia).   Indication:  Sedation      multivitamin with minerals Tabs tablet  Take 1 tablet by mouth daily.   Indication:  Vitamin Supplementation     sertraline 100 MG tablet  Commonly known as:  ZOLOFT  Take 1 tablet (100 mg total) by mouth daily.   Indication:  Major Depressive Disorder           Follow-up Information    Follow up with [facility].   Specialty:  Behavioral Health   Why:  Please follow up with [facility] Transitional Care Team for therapy and medication management services.   [address] [place] [address] [phone]       Follow-up recommendations:   Activity: As tolerated Diet: low sodium, heart healthy Tests: NA Other: See below  Comments:   Take all your medications as prescribed by your mental healthcare provider.  Report any adverse effects and or reactions from your medicines to your outpatient provider promptly.  Patient is instructed and cautioned to not engage in alcohol and or illegal drug use while on prescription medicines.  In the event of worsening symptoms, patient is instructed to call the crisis hotline, 911 and or go to the nearest ED for appropriate evaluation and treatment of symptoms.  Follow-up with your primary care provider for your other medical issues, concerns and or health care needs.   Total Discharge Time:  Greater than 30 minutes.  SignedFransisca Kaufmann NP-C 01/15/2014, 1:21 PM   Patient seen, Suicide Assessment Completed.  Disposition Plan Reviewed

## 2014-01-15 NOTE — Progress Notes (Signed)
[facility] Discharge Plan :  Will you be returning to the same living situation after discharge: Yes,  patient reports that he can return to his friend's residence at discharge. At discharge, do you have transportation home?:Yes,  patient will be provided bus pass or [facility] Do you have the ability to pay for your medications:Yes,  patient will be provided with prescriptions at discharge  Release of information consent forms completed and in the chart;  Patient's signature needed at discharge.  Patient to Follow up at: Follow-up Information    [facility]   Specialty:  Behavioral Health   Why:  [facility] for therapy and medication management services.   Contact information:   [address] [place] [address] [phone]       Patient denies SI/HI:   Yes,  denies    Safety Planning and Suicide Prevention discussed:  Yes,  with patient  Dacy Enrico, [place] CarboKristin L 01/15/2014, 11:20 AM

## 2014-01-15 NOTE — Clinical Social Work Note (Signed)
CSW contacted local shelters:  [facility]- [facility]- voicemail left, awaiting return call [facility]- only 1 bed available, cannot reserve for patient [facility]- no beds available today, possible bed available tomorrow 11/19.   CSW spoke to MD and patient via telephone to update. Patient reports that he can stay with a friend temporarily until bed is available at local shelter.   Samuella BruinKristin Deannie Resetar, MSW, [employer]CSWA Clinical Social Worker [facility] [phone]

## 2014-01-15 NOTE — [facility] Suicide Risk Assessment (Signed)
Demographic Factors:  53 year old caucasian male, divorced, currently homeless  Total Time spent with patient: 30 minutes  Psychiatric Specialty Exam: Physical Exam  ROS  Blood pressure 130/82, pulse 63, temperature 97.7 F (36.5 C), temperature source Oral, resp. rate 19, SpO2 99 %.There is no weight on file to calculate BMI.  General Appearance: better groomed  Eye Contact::  Good  Speech:  Normal Rate  Volume:  Normal  Mood:  significantly imrpoved compared to admission status  Affect:  more reactive  Thought Process:  Goal Directed and Linear  Orientation:  Full (Time, Place, and Person)  Thought Content:  denies hallucinations, no delusions, future oriented  Suicidal Thoughts:  No- denies any self injurious or suicidal ideations at present   Homicidal Thoughts:  No  Memory:  Recent and Remote grossly intact  Judgement:  Other:  improved  Insight:  improved   Psychomotor Activity:  Normal  Concentration:  Good  Recall:  Good  Fund of Knowledge:Good  Language: Good  Akathisia:  No  Handed:  Right  AIMS (if indicated):     Assets:  Desire for Improvement Resilience  Sleep:  Number of Hours: 6.25    Musculoskeletal: Strength & Muscle Tone: within normal limits Gait & Station: normal Patient leans: N/A   Mental Status Per Nursing Assessment::   On Admission:  NA  Current Mental Status by Physician: Patient is significantly improved compared to admission- he presents with improved mood, fuller range of affect, less anxiety, less fatigue, he is more mobile and visible on unit. He is not suicidal or homicidal or psychotic. Physically he feels better as well and has regained some weight.   Loss Factors: Decline in physical health and homelessness, limited support network  Historical Factors: Long history of alcohol dependence, history of depression, no history of prior suicide attempts or violence  Risk Reduction Factors:   Positive coping skills or problem  solving skills and resilience  Continued Clinical Symptoms:  As above, much improved. At this time no lingering withdrawal symptoms, and mood, affect better than upon admission. Not suicidal , homicidal or psychotic, currently future oriented. Remains mildly anxious, but this too is improved compared to prior presentation  Cognitive Features That Contribute To Risk:  No gross cognitive deficits noted upon discharge. Is alert , attentive, and oriented x 3   Suicide Risk:  Mild:  Suicidal ideation of limited frequency, intensity, duration, and specificity.  There are no identifiable plans, no associated intent, mild dysphoria and related symptoms, good self-control (both objective and subjective assessment), few other risk factors, and identifiable protective factors, including available and accessible social support.  Discharge Diagnoses:  Alcohol Dependence, Alcohol Withdrawal, Depression NOS, consider Alcohol  AXIS I:   AXIS II:  Deferred AXIS III:   Past Medical History  Diagnosis Date  . Hypertension   . Degenerative disc disease   . Depression    AXIS IV:  economic problems, occupational problems and problems related to social environment AXIS V:  60 upon discharge   Plan Of Care/Follow-up recommendations:  Activity:  As tolerated Diet:  low sodium, heart healthy Tests:  NA Other:  See below  Is patient on multiple antipsychotic therapies at discharge:  No   Has Patient had three or more failed trials of antipsychotic monotherapy by history:  No  Recommended Plan for Multiple Antipsychotic Therapies: NA  Patient is going to stay with a friend, whom he states is sober and supportive. His plan is to get  into a local shelter after a period of two to three days. He is going to be followed by Transitional Team  Encouraged to abstain from alcohol completely, to continue working on smoking cessation. He has an established PCP, [facility].    Richard Dunn,  Richard Dunn 01/15/2014, 11:02 AM

## 2014-01-15 NOTE — [facility] Group Notes (Signed)
   [facility] Note  01/15/2014  8:45 AM   Participation Quality: Alert, Appropriate and Oriented  Mood/Affect: Appropriate  Depression Rating: 5  Anxiety Rating: 5  Thoughts of Suicide: Pt denies SI/HI  Will you contract for safety? Yes  Current AVH: Pt denies  Plan for Discharge/Comments: Pt attended discharge planning group and actively participated in group. CSW provided pt with today's workbook. Patient reports feeling ready to discharge today. He is agreeable to staying at a local shelter and following up with [facility].    Transportation Means: Pt reports access to transportation via bus or [facility]  Supports: No supports mentioned at this time  Samuella BruinKristin Raquelle Pietro, MSW, [employer]CSWA Clinical Social Worker [facility] [phone]

## 2014-01-15 NOTE — Progress Notes (Signed)
   D: Pt informed the writer that he'd had a "very good" day. Stated he's leaving tomorrow with plans to go to a shelter until the 3rd of Dec when he gets his social security check. Pt voiced no questions or concerns.   A:  Support and encouragement was offered. 15 min checks continued for safety.  R: Pt remains safe.

## 2014-01-15 NOTE — [facility] Suicide Risk Assessment (Signed)
[facility] INPATIENT:  Family/Significant Other Suicide Prevention Education  Suicide Prevention Education:  Patient Refusal for Family/Significant Other Suicide Prevention Education: The patient Richard Dunn has refused to provide written consent for family/significant other to be provided Family/Significant Other Suicide Prevention Education during admission and/or prior to discharge.  Physician notified. SPE reviewed with patient, brochure provided.  Richard Dunn, Richard Dunn 01/15/2014, 11:20 AM

## 2014-01-15 NOTE — Progress Notes (Signed)
Discharge Note: Discharge instructions/prescriptions/medication samples given to patient. Patient verbalized understanding of discharge instructions and prescriptions. Returned belongings to patient. Denies SI/HI/AVH. Patient d/c without incident to the front lobby and transported by Kathlene NovemberMike, a [facility] representative.

## 2014-01-20 NOTE — Progress Notes (Signed)
Patient Discharge Instructions:  After Visit Summary (AVS):   Faxed to:  01/20/14 Discharge Summary Note:   Faxed to:  01/20/14 Psychiatric Admission Assessment Note:   Faxed to:  01/20/14 Suicide Risk Assessment - Discharge Assessment:   Faxed to:  01/20/14 Faxed/Sent to the Next Level Care provider:  01/20/14 [facility] to [facility] @ [phone] Jerelene ReddenSheena E [place], 01/20/2014, 3:14 PM

## 2014-02-15 ENCOUNTER — Encounter (HOSPITAL_COMMUNITY): Payer: Self-pay | Admitting: Emergency Medicine

## 2014-02-15 ENCOUNTER — Emergency Department (HOSPITAL_COMMUNITY)
Admission: EM | Admit: 2014-02-15 | Discharge: 2014-02-15 | Disposition: A | Discharge status: Home / Self Care | Payer: PRIVATE HEALTH INSURANCE | Attending: Emergency Medicine | Admitting: Emergency Medicine

## 2014-02-15 ENCOUNTER — Emergency Department (HOSPITAL_COMMUNITY): Payer: PRIVATE HEALTH INSURANCE

## 2014-02-15 DIAGNOSIS — Z720 Tobacco use: Secondary | ICD-10-CM | POA: Insufficient documentation

## 2014-02-15 DIAGNOSIS — F10920 Alcohol use, unspecified with intoxication, uncomplicated: Secondary | ICD-10-CM

## 2014-02-15 DIAGNOSIS — I10 Essential (primary) hypertension: Secondary | ICD-10-CM | POA: Insufficient documentation

## 2014-02-15 DIAGNOSIS — R569 Unspecified convulsions: Secondary | ICD-10-CM | POA: Insufficient documentation

## 2014-02-15 DIAGNOSIS — Z79899 Other long term (current) drug therapy: Secondary | ICD-10-CM | POA: Insufficient documentation

## 2014-02-15 DIAGNOSIS — F10120 Alcohol abuse with intoxication, uncomplicated: Secondary | ICD-10-CM | POA: Diagnosis not present

## 2014-02-15 DIAGNOSIS — F329 Major depressive disorder, single episode, unspecified: Secondary | ICD-10-CM | POA: Insufficient documentation

## 2014-02-15 DIAGNOSIS — F10129 Alcohol abuse with intoxication, unspecified: Secondary | ICD-10-CM | POA: Diagnosis present

## 2014-02-15 LAB — BASIC METABOLIC PANEL
Anion gap: 17 — ABNORMAL HIGH (ref 5–15)
BUN: 4 mg/dL — AB (ref 6–23)
CALCIUM: 8.9 mg/dL (ref 8.4–10.5)
CO2: 21 mEq/L (ref 19–32)
CREATININE: 0.5 mg/dL (ref 0.50–1.35)
Chloride: 106 mEq/L (ref 96–112)
Glucose, Bld: 109 mg/dL — ABNORMAL HIGH (ref 70–99)
Potassium: 4.2 mEq/L (ref 3.7–5.3)
Sodium: 144 mEq/L (ref 137–147)

## 2014-02-15 LAB — RAPID URINE DRUG SCREEN, HOSP PERFORMED
AMPHETAMINES: NOT DETECTED
Barbiturates: NOT DETECTED
Benzodiazepines: NOT DETECTED
Cocaine: NOT DETECTED
Opiates: NOT DETECTED
Tetrahydrocannabinol: NOT DETECTED

## 2014-02-15 LAB — CBC
HCT: 47.4 % (ref 39.0–52.0)
Hemoglobin: 16.4 g/dL (ref 13.0–17.0)
MCH: 35.9 pg — AB (ref 26.0–34.0)
MCHC: 34.6 g/dL (ref 30.0–36.0)
MCV: 103.7 fL — ABNORMAL HIGH (ref 78.0–100.0)
PLATELETS: 242 10*3/uL (ref 150–400)
RBC: 4.57 MIL/uL (ref 4.22–5.81)
RDW: 13.9 % (ref 11.5–15.5)
WBC: 3.5 10*3/uL — ABNORMAL LOW (ref 4.0–10.5)

## 2014-02-15 LAB — CBG MONITORING, ED: Glucose-Capillary: 113 mg/dL — ABNORMAL HIGH (ref 70–99)

## 2014-02-15 LAB — ETHANOL: ALCOHOL ETHYL (B): 526 mg/dL — AB (ref 0–11)

## 2014-02-15 MED ORDER — THIAMINE HCL 100 MG/ML IJ SOLN
100.0000 mg | Freq: Once | INTRAMUSCULAR | Status: AC
  Administered 2014-02-15: 100 mg via INTRAVENOUS
  Filled 2014-02-15: qty 2

## 2014-02-15 MED ORDER — SODIUM CHLORIDE 0.9 % IV BOLUS (SEPSIS)
1000.0000 mL | Freq: Once | INTRAVENOUS | Status: AC
  Administered 2014-02-15: 1000 mL via INTRAVENOUS

## 2014-02-15 NOTE — ED Notes (Signed)
Pt unable to urinate; Maralyn SagoSarah, RN and Redmond BasemanHayden, RN are in and out cathing pt

## 2014-02-15 NOTE — ED Notes (Signed)
Pt appears to be asleep; no needs at this time

## 2014-02-15 NOTE — ED Notes (Signed)
Pt aware of need of urine specimen; urinal given and placed so pt is attempting at this time to urinate; will check back on him

## 2014-02-15 NOTE — ED Provider Notes (Signed)
CSN: [id number]     Arrival date & time 02/15/14  4098 History   First MD Initiated Contact with Patient 02/15/14 [phone]     Chief Complaint  Patient presents with  . Alcohol Intoxication  . Seizures   Level 5 Caveat  (Consider location/radiation/quality/duration/timing/severity/associated sxs/prior Treatment) Patient is a 53 y.o. male presenting with intoxication and seizures. The history is provided by the EMS personnel and medical records.  Alcohol Intoxication  Seizures   Pt is a 53yo with hx of HTN, degenerative disc disease and depression brought to ED via EMS after pt was found on [address] across from [place]. Pt has no hx of seizures, however, EMS reports suspected seizure activity due to scratch marks on his fingers and heavy alcohol consumption.  Pt was conscious and had slurred speech when EMS was on scene.  Pt has a hx of etoh consuption and IV drug abuse.  Pt reportedly consumes large amounts of mouthwash.    Past Medical History  Diagnosis Date  . Hypertension   . Degenerative disc disease   . Depression    Past Surgical History  Procedure Laterality Date  . Cervical spine surgery     History reviewed. No pertinent family history. History  Substance Use Topics  . Smoking status: Current Some Day Smoker -- 1.50 packs/day    Types: Cigarettes  . Smokeless tobacco: Not on file  . Alcohol Use: Yes     Comment: pt states that he drinks 2-3  fifths of liquor per day.    Review of Systems  Unable to perform ROS: Mental status change  Neurological: Positive for seizures.  extremely intoxicated.     Allergies  Lisinopril; Other; and Sulfonamide derivatives  Home Medications   Prior to Admission medications   Medication Sig Start Date End Date Taking? Authorizing Provider  acamprosate (CAMPRAL) 333 MG tablet Take 2 tablets (666 mg total) by mouth 3 (three) times daily with meals. 01/15/14   Fransisca Kaufmann, NP  amLODipine (NORVASC) 10 MG tablet Take 1 tablet  (10 mg total) by mouth daily. 01/15/14   Fransisca Kaufmann, NP  hydrALAZINE (APRESOLINE) 25 MG tablet Take 1 tablet (25 mg total) by mouth every 8 (eight) hours. 01/15/14   Fransisca Kaufmann, NP  hydrOXYzine (ATARAX/VISTARIL) 50 MG tablet Take 1 tablet (50 mg total) by mouth at bedtime as needed (insomnia). 01/15/14   Fransisca Kaufmann, NP  Multiple Vitamin (MULTIVITAMIN WITH MINERALS) TABS tablet Take 1 tablet by mouth daily. 01/15/14   Fransisca Kaufmann, NP  sertraline (ZOLOFT) 100 MG tablet Take 1 tablet (100 mg total) by mouth daily. 01/15/14   Fransisca Kaufmann, NP   BP 119/79 mmHg  Pulse 99  Temp(Src) 98.6 F (37 C) (Oral)  Resp 16  Ht 5\' 4"  (1.626 m)  Wt 145 lb (65.772 kg)  BMI 24.88 kg/m2  SpO2 96% Physical Exam  Constitutional:  Disheveled appearing male snoring in exam bed, breath smells of etoh  HENT:  Head: Normocephalic and atraumatic.  Eyes: Conjunctivae are normal. Pupils are equal, round, and reactive to light. No scleral icterus.  Neck: Neck supple.  Cardiovascular: Normal rate, regular rhythm and normal heart sounds.   Pulmonary/Chest: Effort normal and breath sounds normal. No respiratory distress. He has no wheezes. He has no rales. He exhibits no tenderness.  Abdominal: Soft. Bowel sounds are normal. He exhibits no distension and no mass. There is no tenderness. There is no rebound and no guarding.  Musculoskeletal: Normal range of motion.  Neurological:  Sleeping but will mumble when agitated   Skin: Skin is warm and dry.  Nursing note and vitals reviewed.   ED Course  Procedures (including critical care time) Labs Review Labs Reviewed  BASIC METABOLIC PANEL - Abnormal; Notable for the following:    Glucose, Bld 109 (*)    BUN 4 (*)    Anion gap 17 (*)    All other components within normal limits  CBC - Abnormal; Notable for the following:    WBC 3.5 (*)    MCV 103.7 (*)    MCH 35.9 (*)    All other components within normal limits  ETHANOL - Abnormal; Notable for the following:     Alcohol, Ethyl (B) 526 (*)    All other components within normal limits  CBG MONITORING, ED - Abnormal; Notable for the following:    Glucose-Capillary 113 (*)    All other components within normal limits  URINE RAPID DRUG SCREEN (HOSP PERFORMED)    Imaging Review Ct Head Wo Contrast   (if New Onset Seizure And/or Head Trauma)  02/15/2014   CLINICAL DATA:  Found down. Question of seizure. Slurred speech. Initial encounter.  EXAM: CT HEAD WITHOUT CONTRAST  TECHNIQUE: Contiguous axial images were obtained from the base of the skull through the vertex without intravenous contrast.  COMPARISON:  CT of the head performed 11/18/2013  FINDINGS: There is no evidence of acute infarction, mass lesion, or intra- or extra-axial hemorrhage on CT.  Prominence of the ventricles and sulci suggest mild cortical volume loss. Mild periventricular white matter change likely reflects small vessel ischemic microangiopathy. Cerebellar atrophy is noted.  The brainstem and fourth ventricle are within normal limits. The basal ganglia are unremarkable in appearance. The cerebral hemispheres demonstrate grossly normal gray-white differentiation. No mass effect or midline shift is seen.  There is no evidence of fracture; visualized osseous structures are unremarkable in appearance. The orbits are within normal limits. The paranasal sinuses and mastoid air cells are well-aerated. No significant soft tissue abnormalities are seen.  IMPRESSION: 1. No acute intracranial pathology seen on CT. 2. Mild cortical volume loss and scattered small vessel ischemic microangiopathy.   Electronically Signed   By: Roanna RaiderJeffery  Chang M.D.   On: 02/15/2014 06:06     EKG Interpretation   Date/Time:  Saturday February 15 2014 05:17:58 EST Ventricular Rate:  78 PR Interval:  188 QRS Duration: 90 QT Interval:  414 QTC Calculation: 472 R Axis:   68 Text Interpretation:  Sinus rhythm Baseline wander in lead(s) V1 V5 V6 No  significant change  since last tracing Confirmed by Erroll Lunani, Adeleke Ayokunle  [id number]) on 02/15/2014 5:27:00 AM      MDM   Final diagnoses:  Alcohol intoxication, uncomplicated    Pt is a 53yo male with hx of etoh and IV drug abuse, brought to ED via EMS with reports of possible seizure. No previous hx of seizures.  Pt non-verbal upon arrival to ED, sleeping but will mumble when agitated.  Labs: significant for ETOH level of 526.  UDS pending.  Head CT: unremarkable. EKG: no significant change since last tracing.   Pt give 1L IV fluids and 100mg  thiamine.  Will allow pt to sober up in ED, may be discharged home if able to ambulate w/o falling.    10:30 AM pt more awake, however still appears groggy, will answer questions. Alert and oriented to person, place and time. Slurred speech. Pt reports drinking liquor. Denies known falls. Denies head, neck, back,  chest or abdominal pain at this time. USD negative. As pt appears to still be intoxicated, unsafe to ambulate at this time. Will allow pt to continue resting prior to ambulating pt.   1:10 PM Pt was able to eat a meal tray and ambulate around nurses station, to restroom and back w/o difficulty.  Pt is stable for discharge home.  Pt has not had a seizure while in ED.  Will discharge home [facility]. Home care instructions including return precautions provided.    Junius FinnerErin O'Malley, PA-C 02/15/14 1454  Tomasita CrumbleAdeleke Oni, MD 02/16/14 [phone]

## 2014-02-15 NOTE — Discharge Instructions (Signed)
Alcohol Intoxication Alcohol intoxication occurs when you drink enough alcohol that it affects your ability to function. It can be mild or very severe. Drinking a lot of alcohol in a short time is called binge drinking. This can be very harmful. Drinking alcohol can also be more dangerous if you are taking medicines or other drugs. Some of the effects caused by alcohol may include:  Loss of coordination.  Changes in mood and behavior.  Unclear thinking.  Trouble talking (slurred speech).  Throwing up (vomiting).  Confusion.  Slowed breathing.  Twitching and shaking (seizures).  Loss of consciousness. HOME CARE  Do not drive after drinking alcohol.  Drink enough water and fluids to keep your pee (urine) clear or pale yellow. Avoid caffeine.  Only take medicine as told by your doctor. GET HELP IF:  You throw up (vomit) many times.  You do not feel better after a few days.  You frequently have alcohol intoxication. Your doctor can help decide if you should see a substance use treatment counselor. GET HELP RIGHT AWAY IF:  You become shaky when you stop drinking.  You have twitching and shaking.  You throw up blood. It may look bright red or like coffee grounds.  You notice blood in your poop (bowel movements).  You become lightheaded or pass out (faint). MAKE SURE YOU:   Understand these instructions.  Will watch your condition.  Will get help right away if you are not doing well or get worse. Document Released: 08/03/2007 Document Revised: 10/17/2012 Document Reviewed: 07/20/2012 [facility] Patient Information 2015 [place], [place]. This information is not intended to replace advice given to you by your health care provider. Make sure you discuss any questions you have with your health care provider.  Alcohol and Nutrition Nutrition serves two purposes. It provides energy. It also maintains body structure and function. Food supplies energy. It also provides the  building blocks needed to replace worn or damaged cells. Alcoholics often eat poorly. This limits their supply of essential nutrients. This affects energy supply and structure maintenance. Alcohol also affects the body's nutrients in:  Digestion.  Storage.  Using and getting rid of waste products. IMPAIRMENT OF NUTRIENT DIGESTION AND UTILIZATION   Once ingested, food must be broken down into small components (digested). Then it is available for energy. It helps maintain body structure and function. Digestion begins in the mouth. It continues in the stomach and intestines, with help from the pancreas. The nutrients from digested food are absorbed from the intestines into the blood. Then they are carried to the liver. The liver prepares nutrients for:  Immediate use.  Storage and future use.  Alcohol inhibits the breakdown of nutrients into usable molecules.  It decreases secretion of digestive enzymes from the pancreas.  Alcohol impairs nutrient absorption by damaging the cells lining the stomach and intestines.  It also interferes with moving some nutrients into the blood.  In addition, nutritional deficiencies themselves may lead to further absorption problems.  For example, folate deficiency changes the cells that line the small intestine. This impairs how water is absorbed. It also affects absorbed nutrients. These include glucose, sodium, and additional folate.  Even if nutrients are digested and absorbed, alcohol can prevent them from being fully used. It changes their transport, storage, and excretion. Impaired utilization of nutrients by alcoholics is indicated by:  Decreased liver stores of vitamins, such as vitamin A.  Increased excretion of nutrients such as fat. ALCOHOL AND ENERGY SUPPLY   Three basic nutritional  components found in food are:  Carbohydrates.  Proteins.  Fats.  These are used as energy. Some alcoholics take in as much as 50% of their total daily  calories from alcohol. They often neglect important foods.  Even when enough food is eaten, alcohol can impair the ways the body controls blood sugar (glucose) levels. It may either increase or decrease blood sugar.  In non-diabetic alcoholics, increased blood sugar (hyperglycemia) is caused by poor insulin secretion. It is usually temporary.  Decreased blood sugar (hypoglycemia) can cause serious injury even if this condition is short-lived. Low blood sugar can happen when a fasting or malnourished person drinks alcohol. When there is no food to supply energy, stored sugar is used up. The products of alcohol inhibit forming glucose from other compounds such as amino acids. As a result, alcohol causes the brain and other body tissue to lack glucose. It is needed for energy and function.  Alcohol is an energy source. But how the body processes and uses the energy from alcohol is complex. Also, when alcohol is substituted for carbohydrates, subjects tend to lose weight. This indicates that they get less energy from alcohol than from food. ALCOHOL - MAINTAINING CELL STRUCTURE AND FUNCTION  Structure Cells are made mostly of protein. So an adequate protein diet is important for maintaining cell structure. This is especially true if cells are being damaged. Research indicates that alcohol affects protein nutrition by causing impaired:  Digestion of proteins to amino acids.  Processing of amino acids by the small intestine and liver.  Synthesis of proteins from amino acids.  Protein secretion by the liver. Function Nutrients are essential for the body to function well. They provide the tools that the body needs to work well:   Proteins.  Vitamins.  Minerals. Alcohol can disrupt body function. It may cause nutrient deficiencies. And it may interfere with the way nutrients are processed. Vitamins  Vitamins are essential to maintain growth and normal metabolism. They regulate many of the body`s  processes. Chronic heavy drinking causes deficiencies in many vitamins. This is caused by eating less. And, in some cases, vitamins may be poorly absorbed. For example, alcohol inhibits fat absorption. It impairs how the vitamins A, E, and D are normally absorbed along with dietary fats. Not enough vitamin A may cause night blindness. Not enough vitamin D may cause softening of the bones.  Some alcoholics lack vitamins A, C, D, E, K, and the B vitamins. These are all involved in wound healing and cell maintenance. In particular, because vitamin K is necessary for blood clotting, lacking that vitamin can cause delayed clotting. The result is excess bleeding. Lacking other vitamins involved in brain function may cause severe neurological damage. Minerals Deficiencies of minerals such as calcium, magnesium, iron, and zinc are common in alcoholics. The alcohol itself does not seem to affect how these minerals are absorbed. Rather, they seem to occur secondary to other alcohol-related problems, such as:  Less calcium absorbed.  Not enough magnesium.  More urinary excretion.  Vomiting.  Diarrhea.  Not enough iron due to gastrointestinal bleeding.  Not enough zinc or losses related to other nutrient deficiencies.  Mineral deficiencies can cause a variety of medical consequences. These range from calcium-related bone disease to zinc-related night blindness and skin lesions. ALCOHOL, MALNUTRITION, AND MEDICAL COMPLICATIONS  Liver Disease   Alcoholic liver damage is caused primarily by alcohol itself. But poor nutrition may increase the risk of alcohol-related liver damage. For example, nutrients normally found in  the liver are known to be affected by drinking alcohol. These include carotenoids, which are the major sources of vitamin A, and vitamin E compounds. Decreases in such nutrients may play some role in alcohol-related liver damage. Pancreatitis  Research suggests that malnutrition may  increase the risk of developing alcoholic pancreatitis. Research suggests that a diet lacking in protein may increase alcohol's damaging effect on the pancreas. Brain  Nutritional deficiencies may have severe effects on brain function. These may be permanent. Specifically, thiamine deficiencies are often seen in alcoholics. They can cause severe neurological problems. These include:  Impaired movement.  Memory loss seen in Wernicke-Korsakoff syndrome. Pregnancy  Alcohol has toxic effects on fetal development. It causes alcohol-related birth defects. They include fetal alcohol syndrome. Alcohol itself is toxic to the fetus. Also, the nutritional deficiency can affect how the fetus develops. That may compound the risk of developmental damage.  Nutritional needs during pregnancy are 10% to 30% greater than normal. Food intake can increase by as much as 140% to cover the needs of both mother and fetus. An alcoholic mother`s nutritional problems may adversely affect the nutrition of the fetus. And alcohol itself can also restrict nutrition flow to the fetus. NUTRITIONAL STATUS OF ALCOHOLICS  Techniques for assessing nutritional status include:  Taking body measurements to estimate fat reserves. They include:  Weight.  Height.  Mass.  Skin fold thickness.  Performing blood analysis to provide measurements of circulating:  Proteins.  Vitamins.  Minerals.  These techniques tend to be imprecise. For many nutrients, there is no clear "cut-off" point that would allow an accurate definition of deficiency. So assessing the nutritional status of alcoholics is limited by these techniques. Dietary status may provide information about the risk of developing nutritional problems. Dietary status is assessed by:  Taking patients' dietary histories.  Evaluating the amount and types of food they are eating.  It is difficult to determine what exact amount of alcohol begins to have damaging effects  on nutrition. In general, moderate drinkers have 2 drinks or less per day. They seem to be at little risk for nutritional problems. Various medical disorders begin to appear at greater levels.  Research indicates that the majority of even the heaviest drinkers have few obvious nutritional deficiencies. Many alcoholics who are hospitalized for medical complications of their disease do have severe malnutrition. Alcoholics tend to eat poorly. Often they eat less than the amounts of food necessary to provide enough:  Carbohydrates.  Protein.  Fat.  Vitamins A and C.  B vitamins.  Minerals like calcium and iron. Of major concern is alcohol's effect on digesting food and use of nutrients. It may shift a mildly malnourished person toward severe malnutrition. Document Released: 12/09/2004 Document Revised: 05/09/2011 Document Reviewed: 05/25/2005 [facility] Patient Information 2015 [place]. This information is not intended to replace advice given to you by your health care provider. Make sure you discuss any questions you have with your health care provider.  Alcohol Withdrawal Alcohol withdrawal happens when you normally drink alcohol a lot and suddenly stop drinking. Alcohol withdrawal symptoms can be mild to very bad. Mild withdrawal symptoms can include feeling sick to your stomach (nauseous), headache, or feeling irritable. Bad withdrawal symptoms can include shakiness, being very nervous (anxious), and not thinking clearly.  HOME CARE  Join an alcohol support group.  Stay away from people or situations that make you want to drink.  Eat a healthy diet. Eat a lot of fresh fruits, vegetables, and lean meats. GET  HELP RIGHT AWAY IF:   You become confused. You start to see and hear things that are not really there.  You feel your heart beating very fast.  You throw up (vomit) blood or cannot stop throwing up. This may be bright red or look like black coffee grounds.  You have blood  in your poop (stool). This may be bright red, maroon colored, or black and tarry.  You are lightheaded or pass out (faint).  You develop a fever. MAKE SURE YOU:   Understand these instructions.  Will watch your condition.  Will get help right away if you are not doing well or get worse. Document Released: 08/03/2007 Document Revised: 05/09/2011 Document Reviewed: 08/03/2007 [facility] Patient Information 2015 [place], [place]. This information is not intended to replace advice given to you by your health care provider. Make sure you discuss any questions you have with your health care provider.

## 2014-02-15 NOTE — ED Notes (Signed)
Per EMS, patient was found on [address] across from [employer]. No history of seizures, but EMS reports suspected seizure activity due to scratch marks on fingers and heavy alcohol consumption.  Slurred speech on arrival, conscious with EMS on scene.  bp 170/114, p 90, LVH noted on 12 lead, otherwise, unremarkable ekg strip. Hx of HTN, ETOH, and IV drug abuse. Patient also consumes large amounts of mouthwash.

## 2014-02-15 NOTE — ED Notes (Signed)
Dr. Mora Bellmanni and PA aware of ETOH result of 526.

## 2014-02-15 NOTE — ED Notes (Signed)
Transported to CT scan

## 2014-08-20 ENCOUNTER — Encounter (HOSPITAL_COMMUNITY): Payer: Self-pay

## 2014-08-20 ENCOUNTER — Emergency Department (HOSPITAL_COMMUNITY)
Admission: EM | Admit: 2014-08-20 | Discharge: 2014-08-20 | Disposition: A | Discharge status: Home / Self Care | Payer: Medicare Other | Attending: Emergency Medicine | Admitting: Emergency Medicine

## 2014-08-20 DIAGNOSIS — Z8739 Personal history of other diseases of the musculoskeletal system and connective tissue: Secondary | ICD-10-CM | POA: Diagnosis not present

## 2014-08-20 DIAGNOSIS — I10 Essential (primary) hypertension: Secondary | ICD-10-CM | POA: Insufficient documentation

## 2014-08-20 DIAGNOSIS — Z720 Tobacco use: Secondary | ICD-10-CM | POA: Diagnosis not present

## 2014-08-20 DIAGNOSIS — F10120 Alcohol abuse with intoxication, uncomplicated: Secondary | ICD-10-CM | POA: Diagnosis not present

## 2014-08-20 DIAGNOSIS — F329 Major depressive disorder, single episode, unspecified: Secondary | ICD-10-CM | POA: Diagnosis not present

## 2014-08-20 DIAGNOSIS — F1210 Cannabis abuse, uncomplicated: Secondary | ICD-10-CM | POA: Diagnosis not present

## 2014-08-20 DIAGNOSIS — Z79899 Other long term (current) drug therapy: Secondary | ICD-10-CM | POA: Diagnosis not present

## 2014-08-20 DIAGNOSIS — F10920 Alcohol use, unspecified with intoxication, uncomplicated: Secondary | ICD-10-CM

## 2014-08-20 DIAGNOSIS — F10129 Alcohol abuse with intoxication, unspecified: Secondary | ICD-10-CM | POA: Diagnosis present

## 2014-08-20 LAB — URINALYSIS, ROUTINE W REFLEX MICROSCOPIC
Bilirubin Urine: NEGATIVE
GLUCOSE, UA: NEGATIVE mg/dL
KETONES UR: NEGATIVE mg/dL
Leukocytes, UA: NEGATIVE
Nitrite: NEGATIVE
Protein, ur: 100 mg/dL — AB
Specific Gravity, Urine: 1.006 (ref 1.005–1.030)
Urobilinogen, UA: 0.2 mg/dL (ref 0.0–1.0)
pH: 5.5 (ref 5.0–8.0)

## 2014-08-20 LAB — RAPID URINE DRUG SCREEN, HOSP PERFORMED
AMPHETAMINES: NOT DETECTED
BARBITURATES: NOT DETECTED
BENZODIAZEPINES: NOT DETECTED
COCAINE: NOT DETECTED
Opiates: NOT DETECTED
Tetrahydrocannabinol: POSITIVE — AB

## 2014-08-20 LAB — BASIC METABOLIC PANEL
ANION GAP: 15 (ref 5–15)
BUN: 7 mg/dL (ref 6–20)
CHLORIDE: 104 mmol/L (ref 101–111)
CO2: 20 mmol/L — ABNORMAL LOW (ref 22–32)
Calcium: 8.3 mg/dL — ABNORMAL LOW (ref 8.9–10.3)
Creatinine, Ser: 0.68 mg/dL (ref 0.61–1.24)
GFR calc Af Amer: >60 mL/min (ref >60)
Glucose, Bld: 151 mg/dL — ABNORMAL HIGH (ref 65–99)
Potassium: 3.6 mmol/L (ref 3.5–5.1)
Sodium: 139 mmol/L (ref 135–145)

## 2014-08-20 LAB — CBC
HCT: 44.8 % (ref 39.0–52.0)
Hemoglobin: 15.5 g/dL (ref 13.0–17.0)
MCH: 32.4 pg (ref 26.0–34.0)
MCHC: 34.6 g/dL (ref 30.0–36.0)
MCV: 93.7 fL (ref 78.0–100.0)
Platelets: 98 10*3/uL — ABNORMAL LOW (ref 150–400)
RBC: 4.78 MIL/uL (ref 4.22–5.81)
RDW: 17.2 % — ABNORMAL HIGH (ref 11.5–15.5)
WBC: 3.2 10*3/uL — ABNORMAL LOW (ref 4.0–10.5)

## 2014-08-20 LAB — ETHANOL: ALCOHOL ETHYL (B): 487 mg/dL — AB (ref <5)

## 2014-08-20 LAB — ACETAMINOPHEN LEVEL

## 2014-08-20 LAB — URINE MICROSCOPIC-ADD ON

## 2014-08-20 LAB — SALICYLATE LEVEL: Salicylate Lvl: <4 mg/dL (ref 2.8–30.0)

## 2014-08-20 NOTE — ED Notes (Signed)
Pt presents for ETOH intoxication and scrotal pain.  Pt reports drinking "a bunch of nasty ass shit" earlier.  Sts scrotal pain has been going on "for a while."  Pt could not give a specific timeframe.

## 2014-08-20 NOTE — ED Provider Notes (Signed)
CSN: [id number]     Arrival date & time 08/20/14  1158 History   First MD Initiated Contact with Patient 08/20/14 1339     Chief Complaint  Patient presents with  . Testicle Pain  . Alcohol Intoxication     (Consider location/radiation/quality/duration/timing/severity/associated sxs/prior Treatment) HPI  A LEVEL 5 CAVEAT PERTAINS DUE TO ALCOHOL INTOXICATION Pt presents due to alcohol intoxication.  He has hx of alcohol abuse and visits to the ED in the past for intoxication.  Pt stated to triage nurse he had scrotal pain, to me he denies this.  He is unwilling to answer further questions.   Past Medical History  Diagnosis Date  . Hypertension   . Degenerative disc disease   . Depression    Past Surgical History  Procedure Laterality Date  . Cervical spine surgery     History reviewed. No pertinent family history. History  Substance Use Topics  . Smoking status: Current Some Day Smoker -- 1.50 packs/day    Types: Cigarettes  . Smokeless tobacco: Not on file  . Alcohol Use: Yes     Comment: pt states that he drinks 2-3  fifths of liquor per day.    Review of Systems  UNABLE TO OBTAIN ROS DUE TO LEVEL 5 CAVEAT    Allergies  Lisinopril; Other; and Sulfonamide derivatives  Home Medications   Prior to Admission medications   Medication Sig Start Date End Date Taking? Authorizing Provider  acamprosate (CAMPRAL) 333 MG tablet Take 2 tablets (666 mg total) by mouth 3 (three) times daily with meals. 01/15/14   Thermon Leyland, NP  amLODipine (NORVASC) 10 MG tablet Take 1 tablet (10 mg total) by mouth daily. 01/15/14   Thermon Leyland, NP  hydrALAZINE (APRESOLINE) 25 MG tablet Take 1 tablet (25 mg total) by mouth every 8 (eight) hours. 01/15/14   Thermon Leyland, NP  hydrOXYzine (ATARAX/VISTARIL) 50 MG tablet Take 1 tablet (50 mg total) by mouth at bedtime as needed (insomnia). 01/15/14   Thermon Leyland, NP  Multiple Vitamin (MULTIVITAMIN WITH MINERALS) TABS tablet Take 1 tablet by  mouth daily. 01/15/14   Thermon Leyland, NP  sertraline (ZOLOFT) 100 MG tablet Take 1 tablet (100 mg total) by mouth daily. 01/15/14   Thermon Leyland, NP   BP 152/104 mmHg  Pulse 109  Temp(Src) 97.7 F (36.5 C) (Oral)  Resp 23  SpO2 95%  Vitals reviewed Physical Exam  Physical Examination: General appearance - alert, well appearing, and in no distress Mental status - alert, oriented to person, not to place or time Eyes - pupils equal and reactive, extraocular eye movements intact Mouth - mucous membranes moist, pharynx normal without lesions Neurological - alert, oriented to person, intoxicated Extremities - peripheral pulses normal, no pedal edema, no clubbing or cyanosis Skin - normal coloration and turgor, no rashes Psych- intoxicated, but calm and cooperative  ED Course  Procedures (including critical care time) Labs Review Labs Reviewed  CBC - Abnormal; Notable for the following:    WBC 3.2 (*)    RDW 17.2 (*)    Platelets 98 (*)    All other components within normal limits  BASIC METABOLIC PANEL - Abnormal; Notable for the following:    CO2 20 (*)    Glucose, Bld 151 (*)    Calcium 8.3 (*)    All other components within normal limits  ETHANOL - Abnormal; Notable for the following:    Alcohol, Ethyl (B) 487 (*)  All other components within normal limits  ACETAMINOPHEN LEVEL - Abnormal; Notable for the following:    Acetaminophen (Tylenol), Serum <10 (*)    All other components within normal limits  SALICYLATE LEVEL  URINE RAPID DRUG SCREEN, HOSP PERFORMED  URINALYSIS, ROUTINE W REFLEX MICROSCOPIC (NOT AT [facility])    Imaging Review No results found.   EKG Interpretation None      MDM   Final diagnoses:  Alcohol intoxication, uncomplicated    Pt presenting with c/o alcohol intoxication.  Labs obtained, etoh 484.  Awaiting urine.  Pt denies scrotal pain to me.  Pt will be observed while sobering up.  If urine is positive for leukocytes may need further  assessment of scrotal pain, or revistiation of this issue when he is able to talk and answer questions.  Pt signed out at change of shift for further observation.      Jerelyn Scott, MD 08/20/14 1538

## 2014-08-20 NOTE — ED Notes (Signed)
Pt is still intoxicated.

## 2014-08-20 NOTE — ED Notes (Signed)
Patient was brought in by his therapist. Patient is intoxicated. Patient states he last drank 6 hours ago. Patient denies SI/HI. Patient denies any auditory or visual hallucinations. Patient c/o scrotal pain. Patient denies any scrotal swelling.

## 2014-08-20 NOTE — ED Provider Notes (Signed)
Patient seen and evaluated. Care was discussed between myself, and Dr. Karma Ganja. Patient reexamined 2. On recheck he is sitting upright. He is awake as I entered the room. He has no specific complaints. He states his abdomen "burns" he has a benign exam. He has no testicular pain or complaints. He is discharged home. Encouraged duty can to stop drinking.  Rolland Porter, MD 08/20/14 2050

## 2014-08-20 NOTE — Discharge Instructions (Signed)
Try your best to stop drinking.  Alcohol Intoxication Alcohol intoxication occurs when you drink enough alcohol that it affects your ability to function. It can be mild or very severe. Drinking a lot of alcohol in a short time is called binge drinking. This can be very harmful. Drinking alcohol can also be more dangerous if you are taking medicines or other drugs. Some of the effects caused by alcohol may include:  Loss of coordination.  Changes in mood and behavior.  Unclear thinking.  Trouble talking (slurred speech).  Throwing up (vomiting).  Confusion.  Slowed breathing.  Twitching and shaking (seizures).  Loss of consciousness. HOME CARE  Do not drive after drinking alcohol.  Drink enough water and fluids to keep your pee (urine) clear or pale yellow. Avoid caffeine.  Only take medicine as told by your doctor. GET HELP IF:  You throw up (vomit) many times.  You do not feel better after a few days.  You frequently have alcohol intoxication. Your doctor can help decide if you should see a substance use treatment counselor. GET HELP RIGHT AWAY IF:  You become shaky when you stop drinking.  You have twitching and shaking.  You throw up blood. It may look bright red or like coffee grounds.  You notice blood in your poop (bowel movements).  You become lightheaded or pass out (faint). MAKE SURE YOU:   Understand these instructions.  Will watch your condition.  Will get help right away if you are not doing well or get worse. Document Released: 08/03/2007 Document Revised: 10/17/2012 Document Reviewed: 07/20/2012 [facility] Patient Information 2015 [place], [place]. This information is not intended to replace advice given to you by your health care provider. Make sure you discuss any questions you have with your health care provider.

## 2015-04-01 DEATH — deceased

## 2015-08-18 IMAGING — CT CT HEAD W/O CM
1 of 2 series · 15 of 30 positions shown, 19 images · non-contrast
Comparison: CT of the head performed 11/18/2013

CLINICAL DATA: Found down. Question of seizure. Slurred speech.
Initial encounter.

EXAM:
CT HEAD WITHOUT CONTRAST
TECHNIQUE: Contiguous axial images were obtained from the base of the skull
through the vertex without intravenous contrast.

[Series 2: head 5.0 h30s · axial · 0.44mm/px · z∈[-158,-33]mm · 15 of 29 slices shown, 19 images]
[im 2/29  brain]
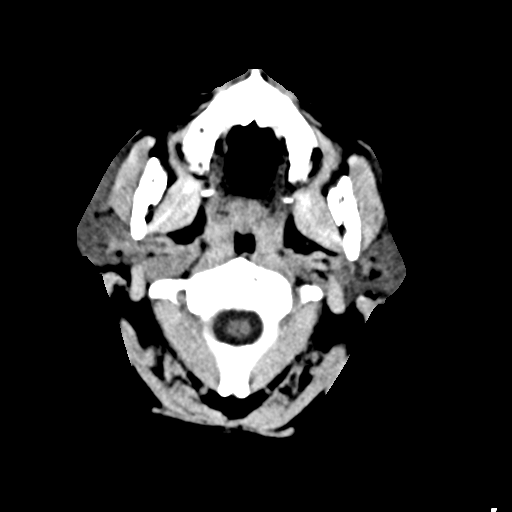
[im 2/29  bone]
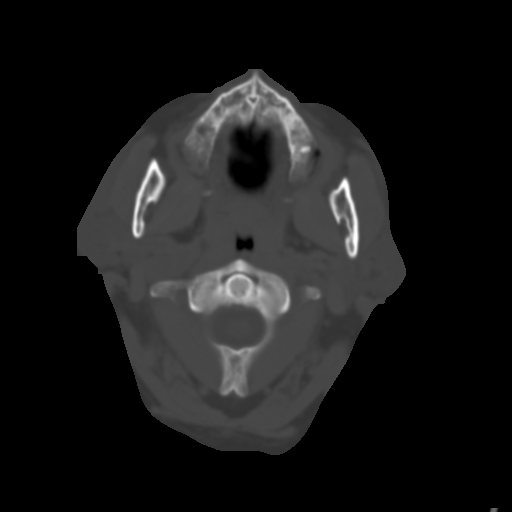
[im 4/29  brain]
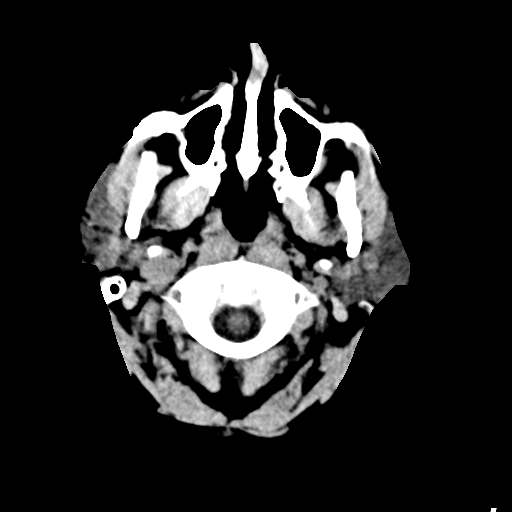
[im 6/29  brain]
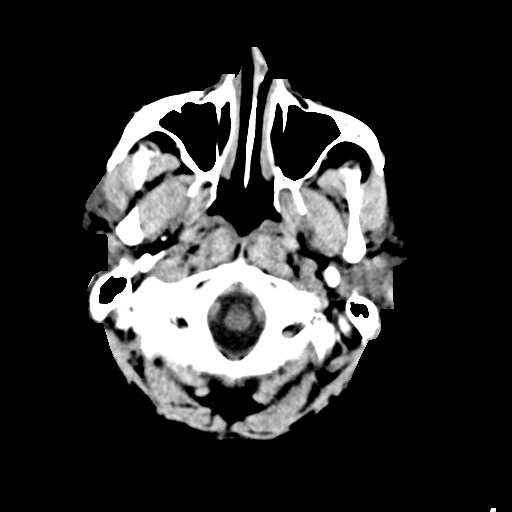
[im 7/29  brain]
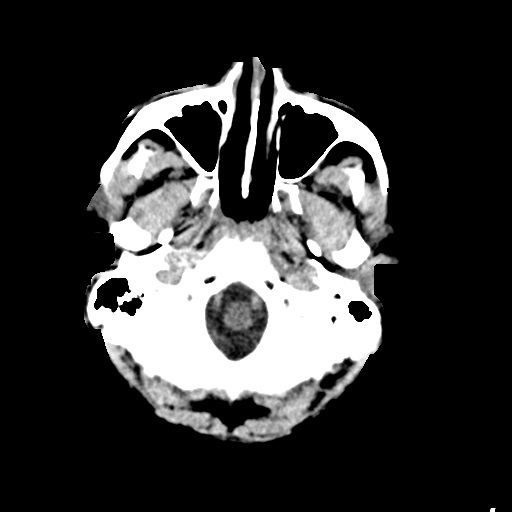
[im 9/29  brain]
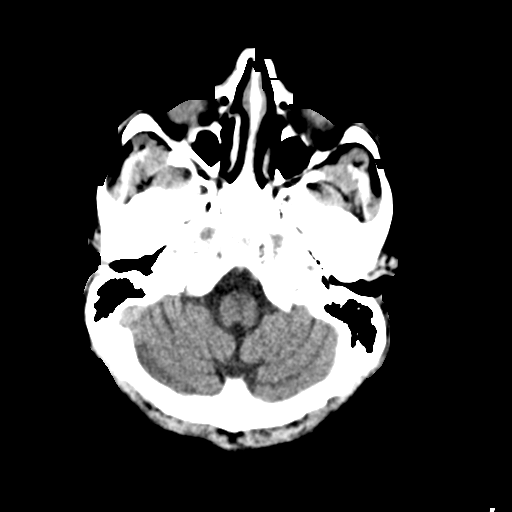
[im 9/29  bone]
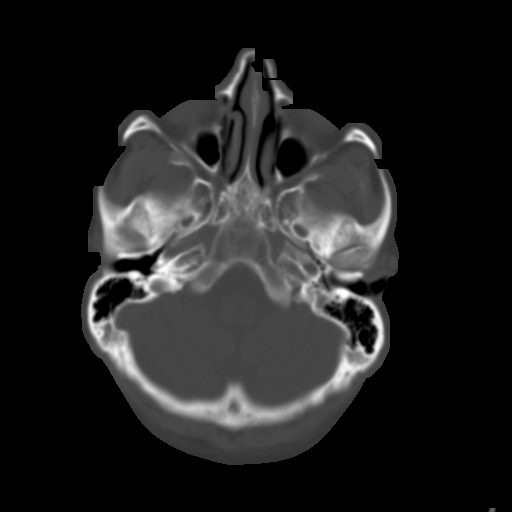
[im 11/29  brain]
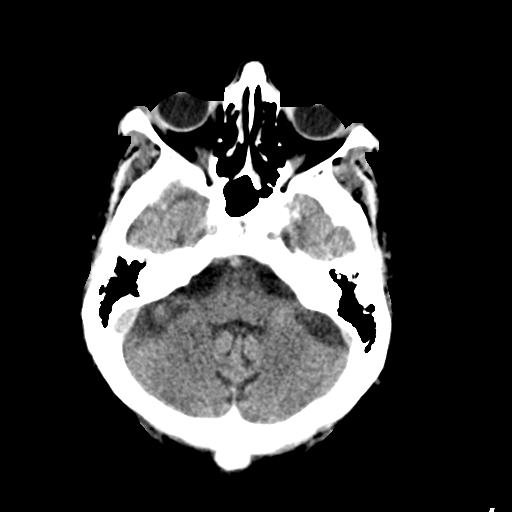
[im 13/29  brain]
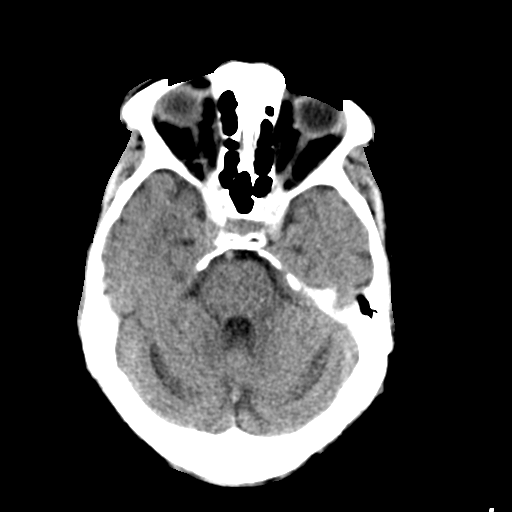
[im 15/29  brain]
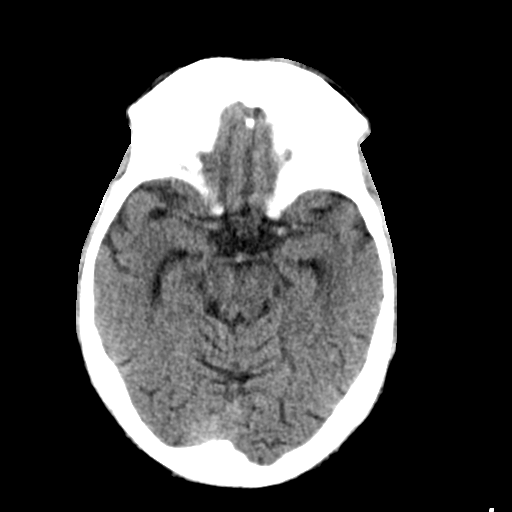
[im 16/29  brain]
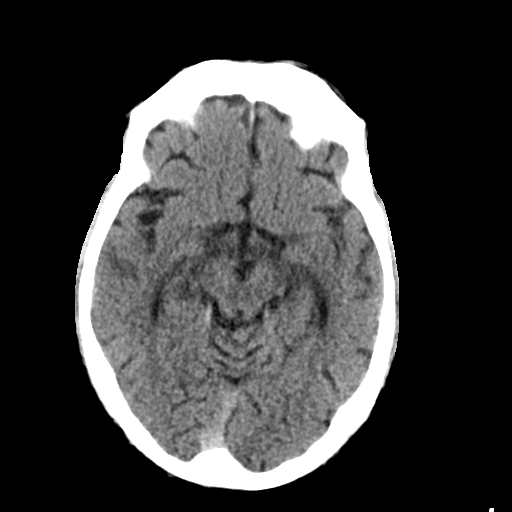
[im 16/29  bone]
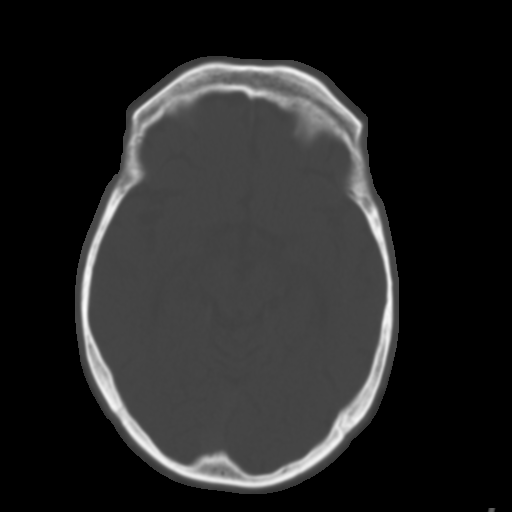
[im 18/29  brain]
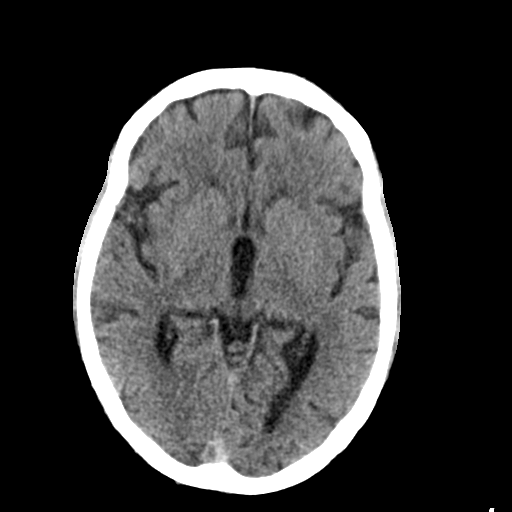
[im 20/29  brain]
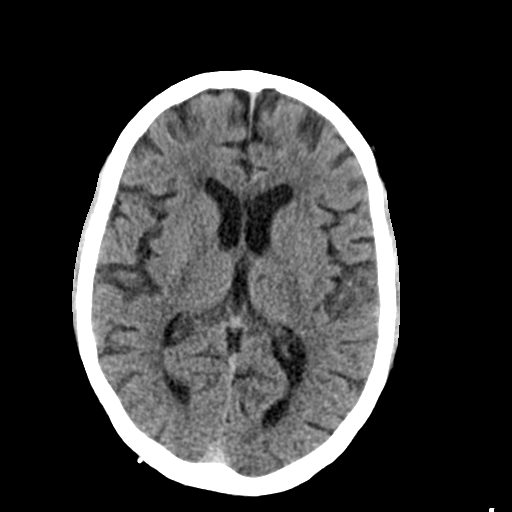
[im 22/29  brain]
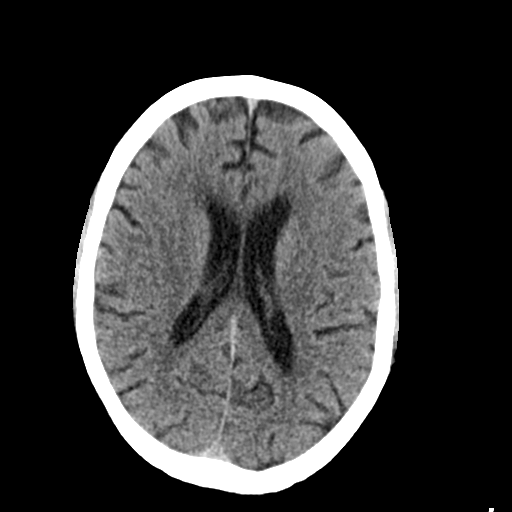
[im 23/29  brain]
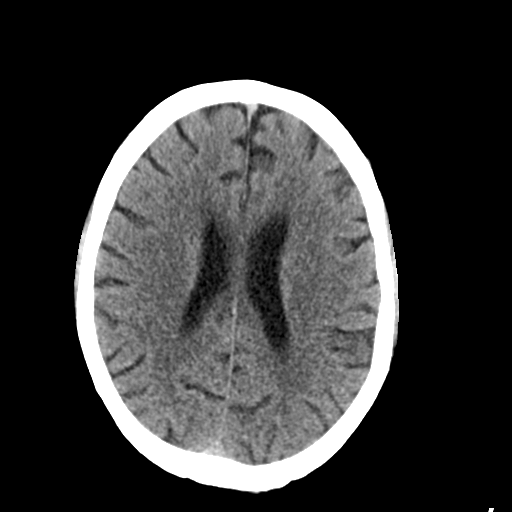
[im 23/29  bone]
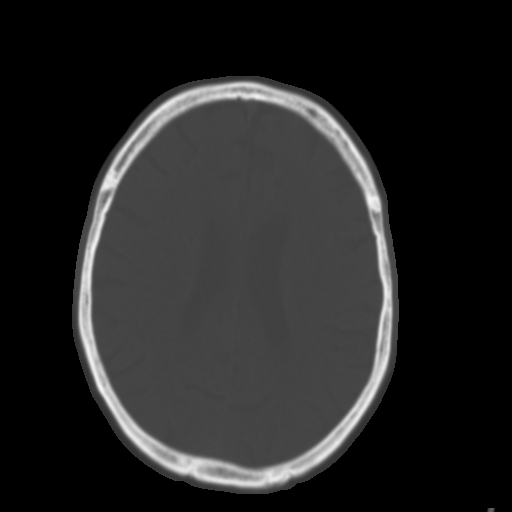
[im 25/29  brain]
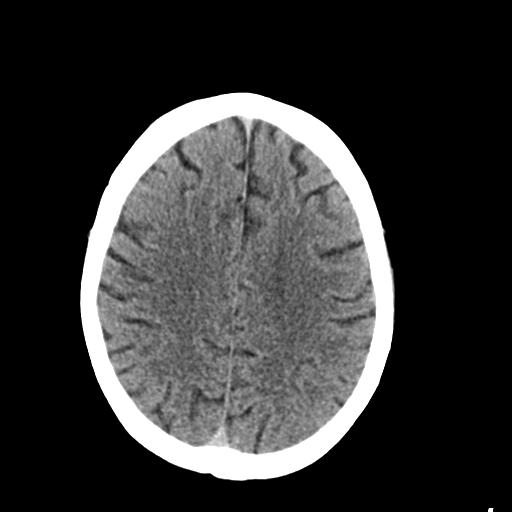
[im 27/29  brain]
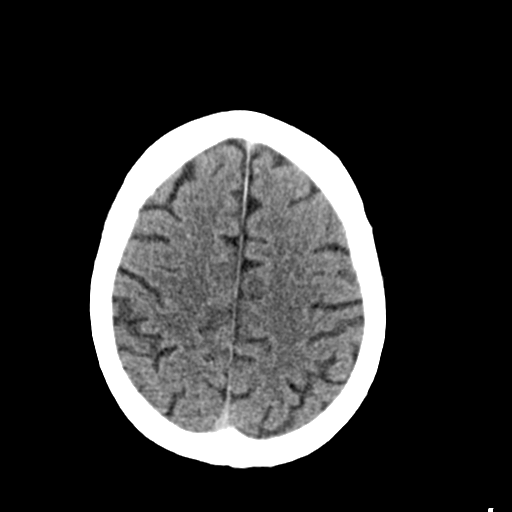

[15 of 30 positions shown; findings below may reference images not displayed]

FINDINGS: There is no evidence of acute infarction, mass lesion, or intra- or
extra-axial hemorrhage on CT.

Prominence of the ventricles and sulci suggest mild cortical volume
loss. Mild periventricular white matter change likely reflects small
vessel ischemic microangiopathy. Cerebellar atrophy is noted.

The brainstem and fourth ventricle are within normal limits. The
basal ganglia are unremarkable in appearance. The cerebral
hemispheres demonstrate grossly normal gray-white differentiation.
No mass effect or midline shift is seen.

There is no evidence of fracture; visualized osseous structures are
unremarkable in appearance. The orbits are within normal limits. The
paranasal sinuses and mastoid air cells are well-aerated. No
significant soft tissue abnormalities are seen.
IMPRESSION: 1. No acute intracranial pathology seen on CT.
2. Mild cortical volume loss and scattered small vessel ischemic
microangiopathy.
